# Patient Record
Sex: Male | Born: 1990 | Race: White | Hispanic: No | Smoking: Never smoker
Health system: Southern US, Community
[De-identification: ages and names within clinical notes are randomized; demographics above are authoritative.]

## PROBLEM LIST (undated history)

## (undated) DIAGNOSIS — F319 Bipolar disorder, unspecified: Secondary | ICD-10-CM

## (undated) DIAGNOSIS — F209 Schizophrenia, unspecified: Secondary | ICD-10-CM

## (undated) DIAGNOSIS — I10 Essential (primary) hypertension: Secondary | ICD-10-CM

---
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
## 2010-12-14 ENCOUNTER — Emergency Department: Payer: Self-pay | Admitting: Emergency Medicine

## 2011-09-15 ENCOUNTER — Emergency Department: Payer: Self-pay | Admitting: Emergency Medicine

## 2012-03-20 ENCOUNTER — Emergency Department: Payer: Self-pay | Admitting: Emergency Medicine

## 2012-07-16 ENCOUNTER — Emergency Department: Payer: Self-pay | Admitting: Emergency Medicine

## 2012-07-20 ENCOUNTER — Emergency Department: Payer: Self-pay | Admitting: Emergency Medicine

## 2012-09-27 ENCOUNTER — Emergency Department: Payer: Self-pay | Admitting: Emergency Medicine

## 2012-09-27 LAB — URINALYSIS, COMPLETE
Bilirubin,UR: NEGATIVE
Blood: NEGATIVE
Glucose,UR: NEGATIVE mg/dL (ref 0–75)
Ketone: NEGATIVE
Leukocyte Esterase: NEGATIVE
Nitrite: NEGATIVE
Ph: 5 (ref 4.5–8.0)
Protein: NEGATIVE
RBC,UR: 2 /HPF (ref 0–5)
Specific Gravity: 1.024 (ref 1.003–1.030)
Squamous Epithelial: NONE SEEN
WBC UR: 2 /HPF (ref 0–5)

## 2012-09-27 LAB — COMPREHENSIVE METABOLIC PANEL
Albumin: 4.4 g/dL (ref 3.4–5.0)
Alkaline Phosphatase: 91 U/L (ref 50–136)
Anion Gap: 10 (ref 7–16)
BUN: 8 mg/dL (ref 7–18)
Bilirubin,Total: 0.3 mg/dL (ref 0.2–1.0)
Calcium, Total: 9.7 mg/dL (ref 8.5–10.1)
Chloride: 108 mmol/L — ABNORMAL HIGH (ref 98–107)
Co2: 21 mmol/L (ref 21–32)
Creatinine: 0.94 mg/dL (ref 0.60–1.30)
EGFR (African American): >60
EGFR (Non-African Amer.): >60
Glucose: 110 mg/dL — ABNORMAL HIGH (ref 65–99)
Osmolality: 277 (ref 275–301)
Potassium: 4.3 mmol/L (ref 3.5–5.1)
SGOT(AST): 30 U/L (ref 15–37)
SGPT (ALT): 62 U/L (ref 12–78)
Sodium: 139 mmol/L (ref 136–145)
Total Protein: 8.2 g/dL (ref 6.4–8.2)

## 2012-09-27 LAB — CBC
HCT: 47.2 % (ref 40.0–52.0)
HGB: 16.4 g/dL (ref 13.0–18.0)
MCH: 30.8 pg (ref 26.0–34.0)
MCHC: 34.6 g/dL (ref 32.0–36.0)
MCV: 89 fL (ref 80–100)
Platelet: 319 10*3/uL (ref 150–440)
RBC: 5.32 10*6/uL (ref 4.40–5.90)
RDW: 12.7 % (ref 11.5–14.5)
WBC: 19.2 10*3/uL — ABNORMAL HIGH (ref 3.8–10.6)

## 2012-09-27 LAB — LIPASE, BLOOD: Lipase: 136 U/L (ref 73–393)

## 2013-01-03 ENCOUNTER — Emergency Department: Payer: Self-pay | Admitting: Emergency Medicine

## 2013-12-25 ENCOUNTER — Emergency Department: Payer: Self-pay | Admitting: Emergency Medicine

## 2014-05-07 ENCOUNTER — Emergency Department: Payer: Self-pay | Admitting: Emergency Medicine

## 2015-04-09 ENCOUNTER — Emergency Department
Admission: EM | Admit: 2015-04-09 | Discharge: 2015-04-09 | Disposition: A | Discharge status: Home / Self Care | Payer: Federal, State, Local not specified - PPO | Attending: Emergency Medicine | Admitting: Emergency Medicine

## 2015-04-09 ENCOUNTER — Telehealth: Payer: Self-pay | Admitting: Emergency Medicine

## 2015-04-09 DIAGNOSIS — Z880 Allergy status to penicillin: Secondary | ICD-10-CM | POA: Insufficient documentation

## 2015-04-09 DIAGNOSIS — J029 Acute pharyngitis, unspecified: Secondary | ICD-10-CM | POA: Insufficient documentation

## 2015-04-09 DIAGNOSIS — M791 Myalgia: Secondary | ICD-10-CM | POA: Insufficient documentation

## 2015-04-09 LAB — POCT RAPID STREP A: STREPTOCOCCUS, GROUP A SCREEN (DIRECT): NEGATIVE

## 2015-04-09 MED ORDER — AZITHROMYCIN 500 MG PO TABS: 500.0000 mg | ORAL_TABLET | Freq: Every day | ORAL | Status: DC

## 2015-04-09 MED ORDER — AZITHROMYCIN 250 MG PO TABS
500.0000 mg | ORAL_TABLET | Freq: Once | ORAL | Status: AC
  Administered 2015-04-09: 500 mg via ORAL

## 2015-04-09 MED ORDER — ACETAMINOPHEN 325 MG PO TABS
650.0000 mg | ORAL_TABLET | Freq: Once | ORAL | Status: AC
  Administered 2015-04-09: 650 mg via ORAL

## 2015-04-09 MED ORDER — ACETAMINOPHEN 325 MG PO TABS
ORAL_TABLET | ORAL | Status: AC
  Administered 2015-04-09: 650 mg via ORAL
  Filled 2015-04-09: qty 2

## 2015-04-09 MED ORDER — LIDOCAINE VISCOUS 2 % MT SOLN: 20.0000 mL | OROMUCOSAL | Status: DC | PRN

## 2015-04-09 MED ORDER — AZITHROMYCIN 500 MG PO TABS: 500.0000 mg | ORAL_TABLET | Freq: Every day | ORAL | Status: AC

## 2015-04-09 MED ORDER — AZITHROMYCIN 250 MG PO TABS
ORAL_TABLET | ORAL | Status: AC
  Administered 2015-04-09: 500 mg via ORAL
  Filled 2015-04-09: qty 2

## 2015-04-09 NOTE — ED Provider Notes (Signed)
[facility] Emergency Department Provider Note  ____________________________________________  Time seen: 5:50AM  I have reviewed the triage vital signs and the nursing notes.   HISTORY  Chief Complaint Sore Throat     HPI Devon Thompson is a 24 y.o. male presents with sore throat and generalized myalgias times one day, subjective fevers at home.     No past medical history on file.  There are no active problems to display for this patient.   No past surgical history on file.  No current outpatient prescriptions on file.  Allergies Penicillins  No family history on file.  Social History History  Substance Use Topics  . Smoking status: Not on file  . Smokeless tobacco: Not on file  . Alcohol Use: Not on file    Review of Systems  Constitutional: Negative for fever. Eyes: Negative for visual changes. ENT: Negative for sore throat. Cardiovascular: Negative for chest pain. Respiratory: Negative for shortness of breath. Gastrointestinal: Negative for abdominal pain, vomiting and diarrhea. Genitourinary: Negative for dysuria. Musculoskeletal: Negative for back pain. Skin: Negative for rash. Neurological: Negative for headaches, focal weakness or numbness.   10-point ROS otherwise negative.  ____________________________________________   PHYSICAL EXAM:  VITAL SIGNS: ED Triage Vitals  Enc Vitals Group     BP 04/09/15 0322 120/80 mmHg     Pulse Rate 04/09/15 0322 114     Resp 04/09/15 0322 18     Temp 04/09/15 0322 100.1 F (37.8 C)     Temp Source 04/09/15 0322 Oral     SpO2 04/09/15 0322 100 %     Weight 04/09/15 0322 220 lb (99.791 kg)     Height 04/09/15 0322 5\' 6"  (1.676 m)     Head Cir --      Peak Flow --      Pain Score 04/09/15 0323 10     Pain Loc --      Pain Edu? --      Excl. in GC? --      Constitutional: Alert and oriented. Well appearing and in no distress. Eyes: Conjunctivae are normal. PERRL.  Normal extraocular movements. ENT   Head: Normocephalic and atraumatic.   Nose: No congestion/rhinnorhea.   Mouth/Throat: Mucous membranes are moist. Pharyngeal erythema noted with scant exudate   Neck: No stridor. Hematological/Lymphatic/Immunilogical: Positive cervical lymphadenopathy. Cardiovascular: Normal rate, regular rhythm. Normal and symmetric distal pulses are present in all extremities. No murmurs, rubs, or gallops. Respiratory: Normal respiratory effort without tachypnea nor retractions. Breath sounds are clear and equal bilaterally. No wheezes/rales/rhonchi. Gastrointestinal: Soft and nontender. No distention. There is no CVA tenderness. Genitourinary: deferred Musculoskeletal: Nontender with normal range of motion in all extremities. No joint effusions.  No lower extremity tenderness nor edema. Neurologic:  Normal speech and language. No gross focal neurologic deficits are appreciated. Speech is normal.  Skin:  Skin is warm, dry and intact. No rash noted. Psychiatric: Mood and affect are normal. Speech and behavior are normal. Patient exhibits appropriate insight and judgment.  ____________________________________________         INITIAL IMPRESSION / ASSESSMENT AND PLAN / ED COURSE  Pertinent labs & imaging results that were available during my care of the patient were reviewed by me and considered in my medical decision making (see chart for details).  History and physical exam consistent with pharyngitis patient received azithromycin 500 mg in the emergency department  ____________________________________________   FINAL CLINICAL IMPRESSION(S) / ED DIAGNOSES  Final diagnoses:  Acute pharyngitis, unspecified  pharyngitis type         Darci Currentandolph N Remas Sobel, MD 04/09/15 [phone]

## 2015-04-09 NOTE — ED Notes (Signed)
Pt called asking for rx for med to numb throat.  Says we gave him 2 rx for antibiotics and forgot the numbing med.  Per dr Mindi Junkergottlieb can give lidocaine sol.  rx written and then called in to [facility] [address]

## 2015-04-09 NOTE — ED Notes (Signed)
Pt with sore throat and fever since yest

## 2015-04-09 NOTE — Discharge Instructions (Signed)

## 2015-04-09 NOTE — ED Notes (Signed)

## 2015-04-12 LAB — CULTURE, GROUP A STREP (THRC)

## 2018-12-02 DIAGNOSIS — Z5181 Encounter for therapeutic drug level monitoring: Secondary | ICD-10-CM | POA: Diagnosis not present

## 2018-12-02 DIAGNOSIS — F1120 Opioid dependence, uncomplicated: Secondary | ICD-10-CM | POA: Diagnosis not present

## 2018-12-02 DIAGNOSIS — K047 Periapical abscess without sinus: Secondary | ICD-10-CM | POA: Diagnosis not present

## 2019-01-03 DIAGNOSIS — Z5181 Encounter for therapeutic drug level monitoring: Secondary | ICD-10-CM | POA: Diagnosis not present

## 2019-01-03 DIAGNOSIS — F1120 Opioid dependence, uncomplicated: Secondary | ICD-10-CM | POA: Diagnosis not present

## 2019-02-01 DIAGNOSIS — F1120 Opioid dependence, uncomplicated: Secondary | ICD-10-CM | POA: Diagnosis not present

## 2019-02-01 DIAGNOSIS — Z5181 Encounter for therapeutic drug level monitoring: Secondary | ICD-10-CM | POA: Diagnosis not present

## 2019-04-25 DIAGNOSIS — Z5181 Encounter for therapeutic drug level monitoring: Secondary | ICD-10-CM | POA: Diagnosis not present

## 2019-05-17 DIAGNOSIS — K047 Periapical abscess without sinus: Secondary | ICD-10-CM | POA: Diagnosis not present

## 2019-05-17 DIAGNOSIS — Z5181 Encounter for therapeutic drug level monitoring: Secondary | ICD-10-CM | POA: Diagnosis not present

## 2019-06-22 DIAGNOSIS — Z5181 Encounter for therapeutic drug level monitoring: Secondary | ICD-10-CM | POA: Diagnosis not present

## 2019-07-20 DIAGNOSIS — Z5181 Encounter for therapeutic drug level monitoring: Secondary | ICD-10-CM | POA: Diagnosis not present

## 2019-07-20 DIAGNOSIS — Z6828 Body mass index (BMI) 28.0-28.9, adult: Secondary | ICD-10-CM | POA: Diagnosis not present

## 2019-08-17 DIAGNOSIS — F1120 Opioid dependence, uncomplicated: Secondary | ICD-10-CM | POA: Diagnosis not present

## 2019-08-17 DIAGNOSIS — Z9852 Vasectomy status: Secondary | ICD-10-CM | POA: Diagnosis not present

## 2019-08-17 DIAGNOSIS — Z6827 Body mass index (BMI) 27.0-27.9, adult: Secondary | ICD-10-CM | POA: Diagnosis not present

## 2019-08-17 DIAGNOSIS — Z5181 Encounter for therapeutic drug level monitoring: Secondary | ICD-10-CM | POA: Diagnosis not present

## 2019-09-14 DIAGNOSIS — Z5181 Encounter for therapeutic drug level monitoring: Secondary | ICD-10-CM | POA: Diagnosis not present

## 2019-09-14 DIAGNOSIS — K047 Periapical abscess without sinus: Secondary | ICD-10-CM | POA: Diagnosis not present

## 2019-09-14 DIAGNOSIS — Z6827 Body mass index (BMI) 27.0-27.9, adult: Secondary | ICD-10-CM | POA: Diagnosis not present

## 2019-09-14 DIAGNOSIS — F1120 Opioid dependence, uncomplicated: Secondary | ICD-10-CM | POA: Diagnosis not present

## 2019-10-13 DIAGNOSIS — Z5181 Encounter for therapeutic drug level monitoring: Secondary | ICD-10-CM | POA: Diagnosis not present

## 2019-11-21 DIAGNOSIS — Z5181 Encounter for therapeutic drug level monitoring: Secondary | ICD-10-CM | POA: Diagnosis not present

## 2019-11-23 ENCOUNTER — Ambulatory Visit: Payer: Federal, State, Local not specified - PPO | Attending: Internal Medicine

## 2020-01-14 ENCOUNTER — Emergency Department: Payer: Medicaid Other

## 2020-01-14 ENCOUNTER — Emergency Department
Admission: EM | Admit: 2020-01-14 | Discharge: 2020-01-14 | Disposition: A | Discharge status: Home / Self Care | Payer: Medicaid Other | Attending: Student | Admitting: Student

## 2020-01-14 DIAGNOSIS — S0990XA Unspecified injury of head, initial encounter: Secondary | ICD-10-CM | POA: Diagnosis not present

## 2020-01-14 DIAGNOSIS — S0003XA Contusion of scalp, initial encounter: Secondary | ICD-10-CM | POA: Diagnosis not present

## 2020-01-14 DIAGNOSIS — Y999 Unspecified external cause status: Secondary | ICD-10-CM | POA: Insufficient documentation

## 2020-01-14 DIAGNOSIS — S0001XA Abrasion of scalp, initial encounter: Secondary | ICD-10-CM | POA: Diagnosis not present

## 2020-01-14 DIAGNOSIS — Y939 Activity, unspecified: Secondary | ICD-10-CM | POA: Insufficient documentation

## 2020-01-14 DIAGNOSIS — T148XXA Other injury of unspecified body region, initial encounter: Secondary | ICD-10-CM

## 2020-01-14 DIAGNOSIS — Z23 Encounter for immunization: Secondary | ICD-10-CM | POA: Insufficient documentation

## 2020-01-14 DIAGNOSIS — Z0289 Encounter for other administrative examinations: Secondary | ICD-10-CM | POA: Insufficient documentation

## 2020-01-14 DIAGNOSIS — Y929 Unspecified place or not applicable: Secondary | ICD-10-CM | POA: Diagnosis not present

## 2020-01-14 DIAGNOSIS — S4991XA Unspecified injury of right shoulder and upper arm, initial encounter: Secondary | ICD-10-CM | POA: Diagnosis present

## 2020-01-14 DIAGNOSIS — Z008 Encounter for other general examination: Secondary | ICD-10-CM

## 2020-01-14 DIAGNOSIS — S42021A Displaced fracture of shaft of right clavicle, initial encounter for closed fracture: Secondary | ICD-10-CM | POA: Diagnosis not present

## 2020-01-14 DIAGNOSIS — M25511 Pain in right shoulder: Secondary | ICD-10-CM | POA: Diagnosis not present

## 2020-01-14 MED ORDER — TETANUS-DIPHTH-ACELL PERTUSSIS 5-2.5-18.5 LF-MCG/0.5 IM SUSP
0.5000 mL | Freq: Once | INTRAMUSCULAR | Status: AC
  Administered 2020-01-14: 0.5 mL via INTRAMUSCULAR
  Filled 2020-01-14: qty 0.5

## 2020-01-14 NOTE — ED Provider Notes (Signed)
[facility] Emergency Department Provider Note  ____________________________________________   First MD Initiated Contact with Patient 01/14/20 1825     (approximate)  I have reviewed the triage vital signs and the nursing notes.  History  Chief Complaint Medical Clearance    HPI Devon Thompson is a 29 y.o. male who presents for medical clearance for jail. Patient refuses to provide significant history, but was reportedly tased by PD.  He is noted to have an abrasion and hematoma to the right occiput and complains of right shoulder and collarbone pain.  Pain is 9/10, located to the right shoulder/collarbone, no radiation, sharp, no alleviating or aggravating components.  Denies any weakness, numbness, tingling of his right hand.  Right-hand-dominant.   Past Medical Hx No past medical history on file.  Problem List There are no problems to display for this patient.   Past Surgical Hx Non contributory  Medications Prior to Admission medications   Medication Sig Start Date End Date Taking? Authorizing Provider  azithromycin (ZITHROMAX) 500 MG tablet Take 1 tablet (500 mg total) by mouth daily. 04/09/15   Gregor Hams, MD  lidocaine (XYLOCAINE) 2 % solution Use as directed 20 mLs in the mouth or throat as needed for mouth pain. 04/09/15   Ferman Hamming L, DO    Allergies Penicillins  Family Hx No family history on file.  Social Hx Social History   Tobacco Use  . Smoking status: Not on file  Substance Use Topics  . Alcohol use: Not on file  . Drug use: Not on file     Review of Systems  Constitutional: Negative for fever, chills. Eyes: Negative for visual changes. ENT: Negative for sore throat. Cardiovascular: Negative for chest pain. Respiratory: Negative for shortness of breath. Gastrointestinal: Negative for nausea, vomiting.  Genitourinary: Negative for dysuria. Musculoskeletal: + R shoulder pain, deformity of mid  clavicle Skin: + abrasion/hematoma to R occiput Neurological: Negative for headaches.   Physical Exam  Vital Signs: ED Triage Vitals  Enc Vitals Group     BP 01/14/20 1814 (!) 137/97     Pulse Rate 01/14/20 1814 (!) 120     Resp 01/14/20 1814 20     Temp 01/14/20 1814 98.2 F (36.8 C)     Temp Source 01/14/20 1814 Oral     SpO2 01/14/20 1814 96 %     Weight 01/14/20 1822 218 lb 4.1 oz (99 kg)     Height 01/14/20 1822 5\' 8"  (1.727 m)     Head Circumference --      Peak Flow --      Pain Score 01/14/20 1815 9     Pain Loc --      Pain Edu? --      Excl. in [place]? --     Constitutional: Alert and oriented.  Head: Normocephalic. Hematoma with abrasion to R occiput.  Eyes: Conjunctivae clear. Sclera anicteric. Nose: No congestion. No rhinorrhea. Neck: No stridor.  No midline tenderness. FROM. Cardiovascular: Normal rate, regular rhythm. Extremities well perfused. 2+ right radial pulse. Fingers WWP. Cap refill less than 2 seconds. Respiratory: Normal respiratory effort.  Chest wall stable, NT. Gastrointestinal: Soft. Non-tender. Non-distended.  Musculoskeletal: TTP to distal clavicle with palpable deformity. No skin tenting. Distally NV in tact. Motor/sensation in tact in radial, ulnar, median distribution. 2+ radial pulse.  Neurologic:  Normal speech and language. No gross focal neurologic deficits are appreciated.  Skin: Abrasion/hematoma as above. Psychiatric: cursing and argumentative on arrival.  Radiology  XR: IMPRESSION:  1. Comminuted mid right clavicular fracture.   CT head:  IMPRESSION:  Normal head CT.    Procedures  Procedure(s) performed (including critical care):  Procedures   Initial Impression / Assessment and Plan / ED Course  29 y.o. male who presents to the ED for medical clearance, as above. Exam notable for hematoma/abrasion to occiput and pain, palpable deformity to R clavicle.   Will updated Tdap, and obtain imaging.    CT negative. XR  reveals mid clavicle fracture. No skin tenting on exam. Closed, and distally NV in tact. Will plan for sling/shoulder immobilizer and follow up with orthopedics, who are aware and agree with plan. Otherwise stable for discharge.   Final Clinical Impression(s) / ED Diagnosis  Final diagnoses:  Closed displaced fracture of shaft of right clavicle, initial encounter  Hematoma  Medical clearance for incarceration       Note:  This document was prepared using Dragon voice recognition software and may include unintentional dictation errors.   Miguel Aschoff., MD 01/15/20 [phone]

## 2020-01-14 NOTE — ED Triage Notes (Addendum)
Pt arrives via [place] police, in police custody ambulatory to hall 1 from the ems bay. Pt is cursing at the officer who is with him. Attempting to triage patient, patient is angry with the officers and staff, attempt to ask what happened and the patient states "same thing that always happens" pt asked if he lost consciousness and patients states, " I don't remember" "isn't that what always happens when you get slammed by the police" when asking patient his height pt responds " I dont't know" pt asked what his weight is, pt states 'does it matter?" attempt to answer pt with reason for weight regarding medications and pt calls this RN " you fucking bitch" triage assessment complete at this point due to pt's aggression  Pt refused to verify his name and birthday

## 2020-01-14 NOTE — ED Notes (Signed)
Pt transported to CT. Officers present.

## 2020-01-14 NOTE — ED Notes (Signed)
This note is not being shared with the patient for the following reason: To prevent harm (release of this note would result in harm to the life or physical safety of the patient or another).   Pt aggressively stepping towards officer, pt is in police custody and wearing handcuffs. Pt cursing officer with, "you fucking bitch! Your mama knows it! Your mama should have had an abortion when she was pregnant with you. You want to fight me don't you! You want to taze me again!"  Pt has an abrasion and hematoma to the right posterior scalp area. Pt is c/o right shoulder pain as well

## 2020-01-14 NOTE — ED Notes (Signed)
Discharge papers reviewed with pt. Discharge papers and shoulder immobilizer given to officer. Unable to obtain electronic signature due to pt being in handcuffs.

## 2020-01-14 NOTE — ED Notes (Signed)
This RN on phone w/ jail RN [phone]). Jail RN advised this RN to send shoulder immobilizer with pt along with discharge papers. No shoulder immobilizer placed at this time, pt remains in handcuffs under police custody. MD made aware.

## 2020-01-14 NOTE — Discharge Instructions (Addendum)
Your imaging showed you have break of your RIGHT collarbone.  Use the sling immobilizer for support and remain in this until you follow up with orthopedics in clinic. Information is below. Do not do any heavy lifting until you are seen in clinic and cleared by the orthopedic doctors.   Return for re-evaluation if you develop any new or worsening symptoms.

## 2020-01-17 DIAGNOSIS — M542 Cervicalgia: Secondary | ICD-10-CM | POA: Diagnosis not present

## 2020-01-17 DIAGNOSIS — Z880 Allergy status to penicillin: Secondary | ICD-10-CM | POA: Diagnosis not present

## 2020-01-17 DIAGNOSIS — F17210 Nicotine dependence, cigarettes, uncomplicated: Secondary | ICD-10-CM | POA: Diagnosis not present

## 2020-01-17 DIAGNOSIS — S42021A Displaced fracture of shaft of right clavicle, initial encounter for closed fracture: Secondary | ICD-10-CM | POA: Diagnosis not present

## 2020-01-17 DIAGNOSIS — R0602 Shortness of breath: Secondary | ICD-10-CM | POA: Diagnosis not present

## 2020-01-17 DIAGNOSIS — R519 Headache, unspecified: Secondary | ICD-10-CM | POA: Diagnosis not present

## 2020-01-17 DIAGNOSIS — M549 Dorsalgia, unspecified: Secondary | ICD-10-CM | POA: Diagnosis not present

## 2020-01-18 DIAGNOSIS — S42021A Displaced fracture of shaft of right clavicle, initial encounter for closed fracture: Secondary | ICD-10-CM | POA: Diagnosis not present

## 2020-01-19 DIAGNOSIS — S42024D Nondisplaced fracture of shaft of right clavicle, subsequent encounter for fracture with routine healing: Secondary | ICD-10-CM | POA: Diagnosis not present

## 2020-01-19 DIAGNOSIS — Z5181 Encounter for therapeutic drug level monitoring: Secondary | ICD-10-CM | POA: Diagnosis not present

## 2020-02-01 DIAGNOSIS — Z5181 Encounter for therapeutic drug level monitoring: Secondary | ICD-10-CM | POA: Diagnosis not present

## 2020-02-15 DIAGNOSIS — Z5181 Encounter for therapeutic drug level monitoring: Secondary | ICD-10-CM | POA: Diagnosis not present

## 2020-05-23 DIAGNOSIS — Z419 Encounter for procedure for purposes other than remedying health state, unspecified: Secondary | ICD-10-CM | POA: Diagnosis not present

## 2020-06-23 DIAGNOSIS — Z419 Encounter for procedure for purposes other than remedying health state, unspecified: Secondary | ICD-10-CM | POA: Diagnosis not present

## 2020-07-24 DIAGNOSIS — Z419 Encounter for procedure for purposes other than remedying health state, unspecified: Secondary | ICD-10-CM | POA: Diagnosis not present

## 2020-08-08 DIAGNOSIS — R7309 Other abnormal glucose: Secondary | ICD-10-CM | POA: Diagnosis not present

## 2020-08-08 DIAGNOSIS — Z743 Need for continuous supervision: Secondary | ICD-10-CM | POA: Diagnosis not present

## 2020-08-08 DIAGNOSIS — E1165 Type 2 diabetes mellitus with hyperglycemia: Secondary | ICD-10-CM | POA: Diagnosis not present

## 2020-08-23 DIAGNOSIS — Z419 Encounter for procedure for purposes other than remedying health state, unspecified: Secondary | ICD-10-CM | POA: Diagnosis not present

## 2020-09-23 DIAGNOSIS — Z419 Encounter for procedure for purposes other than remedying health state, unspecified: Secondary | ICD-10-CM | POA: Diagnosis not present

## 2020-10-23 DIAGNOSIS — Z419 Encounter for procedure for purposes other than remedying health state, unspecified: Secondary | ICD-10-CM | POA: Diagnosis not present

## 2020-11-23 DIAGNOSIS — Z419 Encounter for procedure for purposes other than remedying health state, unspecified: Secondary | ICD-10-CM | POA: Diagnosis not present

## 2020-12-09 ENCOUNTER — Emergency Department: Payer: Medicaid Other

## 2020-12-09 ENCOUNTER — Encounter: Payer: Self-pay | Admitting: Emergency Medicine

## 2020-12-09 ENCOUNTER — Emergency Department
Admission: EM | Admit: 2020-12-09 | Discharge: 2020-12-09 | Disposition: A | Discharge status: Home / Self Care | Payer: Medicaid Other | Attending: Emergency Medicine | Admitting: Emergency Medicine

## 2020-12-09 ENCOUNTER — Other Ambulatory Visit: Payer: Self-pay

## 2020-12-09 DIAGNOSIS — I499 Cardiac arrhythmia, unspecified: Secondary | ICD-10-CM | POA: Diagnosis not present

## 2020-12-09 DIAGNOSIS — F17200 Nicotine dependence, unspecified, uncomplicated: Secondary | ICD-10-CM | POA: Diagnosis not present

## 2020-12-09 DIAGNOSIS — Z8781 Personal history of (healed) traumatic fracture: Secondary | ICD-10-CM | POA: Insufficient documentation

## 2020-12-09 DIAGNOSIS — M25511 Pain in right shoulder: Secondary | ICD-10-CM | POA: Insufficient documentation

## 2020-12-09 DIAGNOSIS — R6889 Other general symptoms and signs: Secondary | ICD-10-CM | POA: Diagnosis not present

## 2020-12-09 DIAGNOSIS — Z743 Need for continuous supervision: Secondary | ICD-10-CM | POA: Diagnosis not present

## 2020-12-09 DIAGNOSIS — R0789 Other chest pain: Secondary | ICD-10-CM | POA: Diagnosis not present

## 2020-12-09 DIAGNOSIS — R079 Chest pain, unspecified: Secondary | ICD-10-CM | POA: Diagnosis not present

## 2020-12-09 DIAGNOSIS — M898X1 Other specified disorders of bone, shoulder: Secondary | ICD-10-CM

## 2020-12-09 NOTE — ED Notes (Signed)
Pt to ED for c/o Rt clavicle pain s/p being tazed by [employer].

## 2020-12-09 NOTE — ED Triage Notes (Signed)
Pt to ED via EMS, under custody of [place] [employer] for medical clearance to jail. Pt c/o right clavicle pain x2 weeks when pt reports he was tased by police. Pt is calm and cooperative with steady gait.

## 2020-12-09 NOTE — ED Provider Notes (Signed)
[facility] Emergency Department Provider Note ____________________________________________  Time seen: 2210  I have reviewed the triage vital signs and the nursing notes.  HISTORY  Chief Complaint  Medical Clearance  HPI Devon Thompson is a 30 y.o. male presents to the ED in the custody [facility] ED, for evaluation of right shoulder pain at the clavicle.   She is requiring medical clearance.  He complains of pain to the right clavicle for the last 2 weeks.  Describes onset after he was tased by police officers in the act of his arrest.  Patient denies any other complaints at this time.  Generally, the patient was seen 6 months ago in February, with a similar story where he was tased by local police authorities during the arrest, he presented with an acute comminuted fracture of the right clavicle at that time.  History reviewed. No pertinent past medical history.  There are no problems to display for this patient.   History reviewed. No pertinent surgical history.  Prior to Admission medications   Not on File    Allergies Penicillins  History reviewed. No pertinent family history.  Social History Social History   Tobacco Use  . Smoking status: Current Every Day Smoker  . Smokeless tobacco: Never Used  Substance Use Topics  . Alcohol use: Yes  . Drug use: Yes    Review of Systems  Constitutional: Negative for fever. Eyes: Negative for visual changes. ENT: Negative for sore throat. Cardiovascular: Negative for chest pain. Respiratory: Negative for shortness of breath. Gastrointestinal: Negative for abdominal pain, vomiting and diarrhea. Genitourinary: Negative for dysuria. Musculoskeletal: Negative for back pain. Right clavicle pain as above.  Skin: Negative for rash. Neurological: Negative for headaches, focal weakness or numbness. ____________________________________________  PHYSICAL EXAM:  VITAL SIGNS: ED Triage Vitals  Enc Vitals  Group     BP 12/09/20 2154 (!) 136/96     Pulse Rate 12/09/20 2154 (!) 120     Resp 12/09/20 2154 18     Temp 12/09/20 2154 98.1 F (36.7 C)     Temp Source 12/09/20 2154 Oral     SpO2 12/09/20 2154 98 %     Weight 12/09/20 2155 170 lb (77.1 kg)     Height 12/09/20 2155 5\' 6"  (1.676 m)     Head Circumference --      Peak Flow --      Pain Score 12/09/20 2154 8     Pain Loc --      Pain Edu? --      Excl. in GC? --     Constitutional: Alert and oriented. Well appearing and in no distress. Head: Normocephalic and atraumatic. Eyes: Conjunctivae are normal. Normal extraocular movements Neck: Supple. Normal ROM Cardiovascular: Normal rate, regular rhythm. Normal distal pulses. Respiratory: Normal respiratory effort. No wheezes/rales/rhonchi. Gastrointestinal: Soft and nontender. No distention. Musculoskeletal: chronic deformity of the right clavicle. Midshaft deformity consistent with a chronic fracture. Normal shoulder ROM. Normal composite fist bilaterally.  Nontender with normal range of motion in all extremities.  Neurologic:  Normal gait without ataxia. Normal speech and language. No gross focal neurologic deficits are appreciated. Skin:  Skin is warm, dry and intact. No rash noted. ___________________________________________   RADIOLOGY  DG Right Clavicle  IMPRESSION: Old right mid clavicle fracture.  No acute osseous abnormality.  I, 2155, personally viewed and evaluated these images (plain radiographs) as part of my medical decision making, as well as reviewing the written report by the radiologist.  ____________________________________________  PROCEDURES  Procedures ____________________________________________  INITIAL IMPRESSION / ASSESSMENT AND PLAN / ED COURSE  Patient to the ED in the custody of [place] [employer], for medical clearance.  Patient presents with complaints of intermittent right clavicle pain after his arrest 2 weeks ago.  Patient's exam  is benign and his x-ray shows a stable, healed, fused old fracture to the right clavicle.  He is discharged at this time to the care of [employer].  He is medically cleared for transport.   Devon Thompson was evaluated in Emergency Department on 12/09/2020 for the symptoms described in the history of present illness. He was evaluated in the context of the global COVID-19 pandemic, which necessitated consideration that the patient might be at risk for infection with the SARS-CoV-2 virus that causes COVID-19. Institutional protocols and algorithms that pertain to the evaluation of patients at risk for COVID-19 are in a state of rapid change based on information released by regulatory bodies including the CDC and federal and state organizations. These policies and algorithms were followed during the patient's care in the ED. ____________________________________________  FINAL CLINICAL IMPRESSION(S) / ED DIAGNOSES  Final diagnoses:  Clavicle pain  H/O clavicle fracture      Karmen Stabs Charlesetta Ivory, PA-C 12/09/20 2249    Phineas Semen, MD 12/09/20 2255

## 2020-12-09 NOTE — Discharge Instructions (Signed)
You exam and XR are normal. You have stable, fused, old healed fracture of your right collar bone. Take OTC Tylenol or Motrin as needed.

## 2020-12-10 ENCOUNTER — Telehealth: Payer: Self-pay

## 2020-12-10 NOTE — Telephone Encounter (Signed)
Transition Care Management Unsuccessful Follow-up Telephone Call  Date of discharge and from where:  12/09/2020 [facility] ED  Attempts:  1st Attempt  Reason for unsuccessful TCM follow-up call:  Left voice message

## 2020-12-11 DIAGNOSIS — S022XXA Fracture of nasal bones, initial encounter for closed fracture: Secondary | ICD-10-CM | POA: Diagnosis not present

## 2020-12-11 NOTE — Telephone Encounter (Signed)
Transition Care Management Unsuccessful Follow-up Telephone Call  Date of discharge and from where:  12/09/2020 [facility] ED  Attempts:  2nd Attempt  Reason for unsuccessful TCM follow-up call:  Left voice message, number on file is for "[employer]"goes straight to voicemail.

## 2020-12-12 ENCOUNTER — Emergency Department: Payer: Medicaid Other

## 2020-12-12 ENCOUNTER — Emergency Department
Admission: EM | Admit: 2020-12-12 | Discharge: 2020-12-12 | Disposition: A | Discharge status: Home / Self Care | Payer: Medicaid Other | Attending: Emergency Medicine | Admitting: Emergency Medicine

## 2020-12-12 ENCOUNTER — Other Ambulatory Visit: Payer: Self-pay

## 2020-12-12 DIAGNOSIS — S022XXA Fracture of nasal bones, initial encounter for closed fracture: Secondary | ICD-10-CM | POA: Diagnosis not present

## 2020-12-12 DIAGNOSIS — R220 Localized swelling, mass and lump, head: Secondary | ICD-10-CM | POA: Diagnosis not present

## 2020-12-12 DIAGNOSIS — M79661 Pain in right lower leg: Secondary | ICD-10-CM | POA: Insufficient documentation

## 2020-12-12 DIAGNOSIS — M545 Low back pain, unspecified: Secondary | ICD-10-CM | POA: Diagnosis not present

## 2020-12-12 DIAGNOSIS — Z0489 Encounter for examination and observation for other specified reasons: Secondary | ICD-10-CM | POA: Diagnosis not present

## 2020-12-12 DIAGNOSIS — Y92149 Unspecified place in prison as the place of occurrence of the external cause: Secondary | ICD-10-CM | POA: Diagnosis not present

## 2020-12-12 DIAGNOSIS — F17200 Nicotine dependence, unspecified, uncomplicated: Secondary | ICD-10-CM | POA: Diagnosis not present

## 2020-12-12 DIAGNOSIS — R102 Pelvic and perineal pain: Secondary | ICD-10-CM | POA: Diagnosis not present

## 2020-12-12 DIAGNOSIS — R109 Unspecified abdominal pain: Secondary | ICD-10-CM | POA: Diagnosis not present

## 2020-12-12 DIAGNOSIS — S3993XA Unspecified injury of pelvis, initial encounter: Secondary | ICD-10-CM | POA: Diagnosis not present

## 2020-12-12 DIAGNOSIS — M542 Cervicalgia: Secondary | ICD-10-CM | POA: Insufficient documentation

## 2020-12-12 DIAGNOSIS — S3991XA Unspecified injury of abdomen, initial encounter: Secondary | ICD-10-CM | POA: Diagnosis not present

## 2020-12-12 DIAGNOSIS — M25511 Pain in right shoulder: Secondary | ICD-10-CM | POA: Insufficient documentation

## 2020-12-12 DIAGNOSIS — S299XXA Unspecified injury of thorax, initial encounter: Secondary | ICD-10-CM | POA: Diagnosis not present

## 2020-12-12 DIAGNOSIS — R079 Chest pain, unspecified: Secondary | ICD-10-CM | POA: Diagnosis not present

## 2020-12-12 DIAGNOSIS — T1490XA Injury, unspecified, initial encounter: Secondary | ICD-10-CM

## 2020-12-12 DIAGNOSIS — S0992XA Unspecified injury of nose, initial encounter: Secondary | ICD-10-CM | POA: Diagnosis present

## 2020-12-12 MED ORDER — ACETAMINOPHEN 500 MG PO TABS
1000.0000 mg | ORAL_TABLET | Freq: Once | ORAL | Status: AC
  Administered 2020-12-12: 1000 mg via ORAL
  Filled 2020-12-12: qty 2

## 2020-12-12 MED ORDER — OXYCODONE HCL 5 MG PO TABS
5.0000 mg | ORAL_TABLET | Freq: Once | ORAL | Status: AC
  Administered 2020-12-12: 5 mg via ORAL
  Filled 2020-12-12: qty 1

## 2020-12-12 MED ORDER — IOHEXOL 300 MG/ML  SOLN
100.0000 mL | Freq: Once | INTRAMUSCULAR | Status: AC | PRN
  Administered 2020-12-12: 100 mL via INTRAVENOUS
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
## 2020-12-12 NOTE — ED Notes (Signed)
Sling given to officer, [employer] will provide for patient when they arrive.

## 2020-12-12 NOTE — ED Notes (Signed)
Patient discharged to home per MD order. Patient in stable condition, and deemed medically cleared by ED provider for discharge. Discharge instructions reviewed with patient/family using "Teach Back"; verbalized understanding of medication education and administration, and information about follow-up care. Denies further concerns. ° °

## 2020-12-24 DIAGNOSIS — Z419 Encounter for procedure for purposes other than remedying health state, unspecified: Secondary | ICD-10-CM | POA: Diagnosis not present

## 2021-01-20 DIAGNOSIS — L0291 Cutaneous abscess, unspecified: Secondary | ICD-10-CM | POA: Diagnosis not present

## 2021-01-20 DIAGNOSIS — K0889 Other specified disorders of teeth and supporting structures: Secondary | ICD-10-CM | POA: Diagnosis not present

## 2021-01-21 DIAGNOSIS — Z419 Encounter for procedure for purposes other than remedying health state, unspecified: Secondary | ICD-10-CM | POA: Diagnosis not present

## 2021-02-21 DIAGNOSIS — Z419 Encounter for procedure for purposes other than remedying health state, unspecified: Secondary | ICD-10-CM | POA: Diagnosis not present

## 2021-03-23 DIAGNOSIS — Z419 Encounter for procedure for purposes other than remedying health state, unspecified: Secondary | ICD-10-CM | POA: Diagnosis not present

## 2021-04-23 DIAGNOSIS — Z419 Encounter for procedure for purposes other than remedying health state, unspecified: Secondary | ICD-10-CM | POA: Diagnosis not present

## 2021-05-23 DIAGNOSIS — Z419 Encounter for procedure for purposes other than remedying health state, unspecified: Secondary | ICD-10-CM | POA: Diagnosis not present

## 2021-06-23 DIAGNOSIS — Z419 Encounter for procedure for purposes other than remedying health state, unspecified: Secondary | ICD-10-CM | POA: Diagnosis not present

## 2021-07-24 DIAGNOSIS — Z419 Encounter for procedure for purposes other than remedying health state, unspecified: Secondary | ICD-10-CM | POA: Diagnosis not present

## 2021-08-15 ENCOUNTER — Emergency Department: Payer: Medicaid Other

## 2021-08-15 ENCOUNTER — Emergency Department
Admission: EM | Admit: 2021-08-15 | Discharge: 2021-08-16 | Disposition: A | Discharge status: Home / Self Care | Payer: Medicaid Other | Attending: Emergency Medicine | Admitting: Emergency Medicine

## 2021-08-15 DIAGNOSIS — F430 Acute stress reaction: Secondary | ICD-10-CM | POA: Diagnosis not present

## 2021-08-15 DIAGNOSIS — S0990XA Unspecified injury of head, initial encounter: Secondary | ICD-10-CM | POA: Diagnosis present

## 2021-08-15 DIAGNOSIS — S199XXA Unspecified injury of neck, initial encounter: Secondary | ICD-10-CM | POA: Diagnosis not present

## 2021-08-15 DIAGNOSIS — Z20822 Contact with and (suspected) exposure to covid-19: Secondary | ICD-10-CM | POA: Insufficient documentation

## 2021-08-15 DIAGNOSIS — F919 Conduct disorder, unspecified: Secondary | ICD-10-CM | POA: Insufficient documentation

## 2021-08-15 DIAGNOSIS — R4689 Other symptoms and signs involving appearance and behavior: Secondary | ICD-10-CM

## 2021-08-15 DIAGNOSIS — X838XXA Intentional self-harm by other specified means, initial encounter: Secondary | ICD-10-CM | POA: Diagnosis not present

## 2021-08-15 DIAGNOSIS — M542 Cervicalgia: Secondary | ICD-10-CM | POA: Insufficient documentation

## 2021-08-15 DIAGNOSIS — F17200 Nicotine dependence, unspecified, uncomplicated: Secondary | ICD-10-CM | POA: Diagnosis not present

## 2021-08-15 DIAGNOSIS — S0003XA Contusion of scalp, initial encounter: Secondary | ICD-10-CM | POA: Diagnosis not present

## 2021-08-15 DIAGNOSIS — Z046 Encounter for general psychiatric examination, requested by authority: Secondary | ICD-10-CM | POA: Insufficient documentation

## 2021-08-15 DIAGNOSIS — F438 Other reactions to severe stress: Secondary | ICD-10-CM | POA: Diagnosis not present

## 2021-08-15 DIAGNOSIS — F418 Other specified anxiety disorders: Secondary | ICD-10-CM | POA: Diagnosis not present

## 2021-08-15 DIAGNOSIS — R4182 Altered mental status, unspecified: Secondary | ICD-10-CM | POA: Diagnosis not present

## 2021-08-15 DIAGNOSIS — Y92149 Unspecified place in prison as the place of occurrence of the external cause: Secondary | ICD-10-CM | POA: Insufficient documentation

## 2021-08-15 LAB — COMPREHENSIVE METABOLIC PANEL
ALT: 19 U/L (ref 0–44)
AST: 22 U/L (ref 15–41)
Albumin: 4.1 g/dL (ref 3.5–5.0)
Alkaline Phosphatase: 63 U/L (ref 38–126)
Anion gap: 6 (ref 5–15)
BUN: 14 mg/dL (ref 6–20)
CO2: 29 mmol/L (ref 22–32)
Calcium: 9.3 mg/dL (ref 8.9–10.3)
Chloride: 102 mmol/L (ref 98–111)
Creatinine, Ser: 1.03 mg/dL (ref 0.61–1.24)
GFR, Estimated: >60 mL/min (ref >60)
Glucose, Bld: 101 mg/dL — ABNORMAL HIGH (ref 70–99)
Potassium: 4.1 mmol/L (ref 3.5–5.1)
Sodium: 137 mmol/L (ref 135–145)
Total Bilirubin: 0.8 mg/dL (ref 0.3–1.2)
Total Protein: 7.2 g/dL (ref 6.5–8.1)

## 2021-08-15 LAB — CBC WITH DIFFERENTIAL/PLATELET
Abs Immature Granulocytes: 0.05 10*3/uL (ref 0.00–0.07)
Basophils Absolute: 0.1 10*3/uL (ref 0.0–0.1)
Basophils Relative: 0 %
Eosinophils Absolute: 0 10*3/uL (ref 0.0–0.5)
Eosinophils Relative: 0 %
HCT: 43.3 % (ref 39.0–52.0)
Hemoglobin: 15.4 g/dL (ref 13.0–17.0)
Immature Granulocytes: 0 %
Lymphocytes Relative: 18 %
Lymphs Abs: 3.1 10*3/uL (ref 0.7–4.0)
MCH: 30.8 pg (ref 26.0–34.0)
MCHC: 35.6 g/dL (ref 30.0–36.0)
MCV: 86.6 fL (ref 80.0–100.0)
Monocytes Absolute: 1.7 10*3/uL — ABNORMAL HIGH (ref 0.1–1.0)
Monocytes Relative: 10 %
Neutro Abs: 12.1 10*3/uL — ABNORMAL HIGH (ref 1.7–7.7)
Neutrophils Relative %: 72 %
Platelets: 332 10*3/uL (ref 150–400)
RBC: 5 MIL/uL (ref 4.22–5.81)
RDW: 11.6 % (ref 11.5–15.5)
WBC: 16.9 10*3/uL — ABNORMAL HIGH (ref 4.0–10.5)
nRBC: 0 % (ref 0.0–0.2)

## 2021-08-15 LAB — SALICYLATE LEVEL: Salicylate Lvl: <7 mg/dL — ABNORMAL LOW (ref 7.0–30.0)

## 2021-08-15 LAB — RESP PANEL BY RT-PCR (FLU A&B, COVID) ARPGX2
Influenza A by PCR: NEGATIVE
Influenza B by PCR: NEGATIVE
SARS Coronavirus 2 by RT PCR: NEGATIVE

## 2021-08-15 LAB — ETHANOL: Alcohol, Ethyl (B): <10 mg/dL (ref <10)

## 2021-08-15 LAB — ACETAMINOPHEN LEVEL: Acetaminophen (Tylenol), Serum: 15 ug/mL (ref 10–30)

## 2021-08-15 MED ORDER — DIPHENHYDRAMINE HCL 50 MG/ML IJ SOLN
50.0000 mg | Freq: Once | INTRAMUSCULAR | Status: AC
  Administered 2021-08-15: 50 mg via INTRAMUSCULAR
  Filled 2021-08-15: qty 1

## 2021-08-15 MED ORDER — HALOPERIDOL LACTATE 5 MG/ML IJ SOLN
5.0000 mg | Freq: Once | INTRAMUSCULAR | Status: AC
  Administered 2021-08-15: 5 mg via INTRAMUSCULAR
  Filled 2021-08-15: qty 1

## 2021-08-15 MED ORDER — ACETAMINOPHEN 500 MG PO TABS
1000.0000 mg | ORAL_TABLET | Freq: Once | ORAL | Status: AC
  Administered 2021-08-15: 1000 mg via ORAL
  Filled 2021-08-15: qty 2

## 2021-08-15 MED ORDER — MIDAZOLAM HCL 2 MG/2ML IJ SOLN
2.0000 mg | Freq: Once | INTRAMUSCULAR | Status: AC
  Administered 2021-08-15: 2 mg via INTRAMUSCULAR
  Filled 2021-08-15: qty 2

## 2021-08-15 MED ORDER — NICOTINE 21 MG/24HR TD PT24
21.0000 mg | MEDICATED_PATCH | Freq: Once | TRANSDERMAL | Status: DC
  Administered 2021-08-15: 21 mg via TRANSDERMAL
  Filled 2021-08-15: qty 1
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
## 2021-08-15 NOTE — ED Notes (Signed)
[facility] PENDING  CONSULT ?
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
## 2021-08-15 NOTE — ED Notes (Signed)
Pt was seated in the bed in hallway, this staff was at the nurses station charting pt jumped up and started running down the hallway.  Staff stated "Hey, where are you going" pt ran and attempted to exit through locked door, pt was unsuccessful he then ran into the ED exit where security was able to stop him.
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
## 2021-08-15 NOTE — BH Assessment (Signed)
This writer attempted to assess pt however pt would not arouse due to being medicated.

## 2021-08-15 NOTE — ED Provider Notes (Signed)
While pending psychiatric evaluation patient did attempt to sprint down the hall and run from facility.  He was brought back to room by security and sedated with IM medications.   Gilles Chiquito, MD 08/15/21 [phone]

## 2021-08-15 NOTE — ED Notes (Signed)
Pt given a turkey sandwich tray and a cup of sprite.  

## 2021-08-15 NOTE — ED Notes (Signed)
IVC  CONSULT  DONE   

## 2021-08-15 NOTE — ED Notes (Signed)
The patient arrived by [facility] , pt has been served IVC paperwork and stated understanding.  There is Security supervision.  Pt stated that he is not SI/HI has no A/V hallucinations.  He denies any prior psych hx. Pt stated that he "only said that so I could get my phone call."

## 2021-08-16 DIAGNOSIS — F430 Acute stress reaction: Secondary | ICD-10-CM | POA: Diagnosis not present

## 2021-08-16 DIAGNOSIS — F330 Major depressive disorder, recurrent, mild: Secondary | ICD-10-CM | POA: Diagnosis not present

## 2021-08-16 MED ORDER — HYDROXYZINE HCL 25 MG PO TABS
50.0000 mg | ORAL_TABLET | Freq: Once | ORAL | Status: AC
  Administered 2021-08-16: 50 mg via ORAL
  Filled 2021-08-16: qty 2

## 2021-08-16 NOTE — ED Provider Notes (Signed)
Emergency Medicine Observation Re-evaluation Note  Devon Thompson is a 30 y.o. male, seen on rounds today.  Pt initially presented to the ED for complaints of Suicidal Currently, the patient is resting, voices no medical complaints.  Physical Exam  BP 122/82 (BP Location: Left Arm)   Pulse 87   Temp 98.2 F (36.8 C) (Oral)   Resp 20   Ht 5\' 7"  (1.702 m)   Wt 77.1 kg   SpO2 100%   BMI 26.63 kg/m  Physical Exam General: Resting in no acute distress Cardiac: No cyanosis Lungs: Equal rise and fall Psych: Not agitated  ED Course / MDM  EKG:   I have reviewed the labs performed to date as well as medications administered while in observation.  Recent changes in the last 24 hours include no changes overnight.  Plan  Current plan is for psychiatric disposition. EDRIS FRIEDT is under involuntary commitment.      Joylene John, MD 08/16/21 [phone]

## 2021-08-16 NOTE — Consult Note (Signed)
[facility] Psych ED Discharge  08/16/2021 12:57 PM Devon Thompson  MRN:  [id number]  Method of visit?: Face to Face   Principal Problem: Stress reaction causing mixed disturbance of emotion and conduct Discharge Diagnoses: Principal Problem:   Stress reaction causing mixed disturbance of emotion and conduct   Subjective: "I'm good, ready to go."  30 yo male presented after being bonded out of jail for threatening self-harm.  Calm and cooperative in the ED.  When he did not get to discharge yesterday, he tried to elope as he felt he was ready to go.  Today, he continues to deny suicidal/homicidal ideations.  No hallucinations or recent drug use.  His mother is still agreeable to have him return to live with her.  Psychiatrically stable for discharge.  Total Time spent with patient: 45 minutes  Past Psychiatric History: substance use, anxiety  Past Medical History: No past medical history on file. No past surgical history on file. Family History: No family history on file. Family Psychiatric  History: none Social History:  Social History   Substance and Sexual Activity  Alcohol Use Yes     Social History   Substance and Sexual Activity  Drug Use Yes    Social History   Socioeconomic History   Marital status: Married    Spouse name: Not on file   Number of children: Not on file   Years of education: Not on file   Highest education level: Not on file  Occupational History   Not on file  Tobacco Use   Smoking status: Every Day   Smokeless tobacco: Never  Substance and Sexual Activity   Alcohol use: Yes   Drug use: Yes   Sexual activity: Not on file  Other Topics Concern   Not on file  Social History Narrative   Not on file   Social Determinants of Health   Financial Resource Strain: Not on file  Food Insecurity: Not on file  Transportation Needs: Not on file  Physical Activity: Not on file  Stress: Not on file  Social Connections: Not on file    Tobacco  Cessation:  A prescription for an FDA-approved tobacco cessation medication was offered at discharge and the patient refused  Current Medications: No current facility-administered medications for this encounter.   No current outpatient medications on file.   PTA Medications: (Not in a hospital admission)   Musculoskeletal: Strength & Muscle Tone: within normal limits Gait & Station: normal Patient leans: N/A  Psychiatric Specialty Exam: Physical Exam Vitals and nursing note reviewed.  Constitutional:      Appearance: Normal appearance.  HENT:     Head: Normocephalic.     Nose: Nose normal.  Pulmonary:     Effort: Pulmonary effort is normal.  Musculoskeletal:        General: Normal range of motion.     Cervical back: Normal range of motion.  Neurological:     General: No focal deficit present.     Mental Status: He is alert and oriented to person, place, and time.  Psychiatric:        Attention and Perception: Attention and perception normal.        Mood and Affect: Mood is anxious.        Speech: Speech normal.        Behavior: Behavior normal. Behavior is cooperative.        Thought Content: Thought content normal.        Cognition and Memory: Cognition and  memory normal.        Judgment: Judgment normal.    Review of Systems  Psychiatric/Behavioral:  The patient is nervous/anxious.   All other systems reviewed and are negative.  Blood pressure (!) 97/55, pulse 86, temperature 98.6 F (37 C), temperature source Oral, resp. rate 17, height 5\' 7"  (1.702 m), weight 77.1 kg, SpO2 99 %.Body mass index is 26.63 kg/m.  General Appearance: Casual  Eye Contact:  Good  Speech:  Normal Rate  Volume:  Normal  Mood:  Anxious  Affect:  Congruent  Thought Process:  Coherent and Descriptions of Associations: Intact  Orientation:  Full (Time, Place, and Person)  Thought Content:  WDL and Logical  Suicidal Thoughts:  No  Homicidal Thoughts:  No  Memory:  Immediate;    Good Recent;   Good Remote;   Good  Judgement:  Fair  Insight:  Fair  Psychomotor Activity:  Normal  Concentration:  Concentration: Good and Attention Span: Good  Recall:  Good  Fund of Knowledge:  Fair  Language:  Good  Akathisia:  No  Handed:  Right  AIMS (if indicated):     Assets:  Housing Leisure Time Physical Health Resilience Social Support  ADL's:  Intact  Cognition:  WNL  Sleep:         Physical Exam: Physical Exam Vitals and nursing note reviewed.  Constitutional:      Appearance: Normal appearance.  HENT:     Head: Normocephalic.     Nose: Nose normal.  Pulmonary:     Effort: Pulmonary effort is normal.  Musculoskeletal:        General: Normal range of motion.     Cervical back: Normal range of motion.  Neurological:     General: No focal deficit present.     Mental Status: He is alert and oriented to person, place, and time.  Psychiatric:        Attention and Perception: Attention and perception normal.        Mood and Affect: Mood is anxious.        Speech: Speech normal.        Behavior: Behavior normal. Behavior is cooperative.        Thought Content: Thought content normal.        Cognition and Memory: Cognition and memory normal.        Judgment: Judgment normal.   Review of Systems  Psychiatric/Behavioral:  The patient is nervous/anxious.   All other systems reviewed and are negative. Blood pressure (!) 97/55, pulse 86, temperature 98.6 F (37 C), temperature source Oral, resp. rate 17, height 5\' 7"  (1.702 m), weight 77.1 kg, SpO2 99 %. Body mass index is 26.63 kg/m.   Demographic Factors:  Male and Caucasian  Loss Factors: Legal issues  Historical Factors: Impulsivity  Risk Reduction Factors:   Sense of responsibility to family, Living with another person, especially a relative, and Positive social support  Continued Clinical Symptoms:  Anxiety, mild  Cognitive Features That Contribute To Risk:  None    Suicide Risk:   Minimal: No identifiable suicidal ideation.  Patients presenting with no risk factors but with morbid ruminations; may be classified as minimal risk based on the severity of the depressive symptoms   Follow-up Information     [facility] Go in 2 day(s).   Contact information: [address] [place] [phone]  Plan Of Care/Follow-up recommendations:  Stress reaction with mixed disturbance of emotions and conduct: -Follow up with anger management in [facility] Activity:  as tolerated  Diet:  heart healthy diet  Disposition: discharge home Nanine Means, NP 08/16/2021, 12:57 PM

## 2021-08-16 NOTE — ED Provider Notes (Signed)
Patient seen by psych NP, IVC revoked, he is stable for discharge.   Georga Hacking, MD 08/16/21 1106

## 2021-08-16 NOTE — ED Notes (Signed)
IVC pending reassessment 

## 2021-08-18 ENCOUNTER — Telehealth: Payer: Self-pay

## 2021-08-18 NOTE — Telephone Encounter (Signed)
Transition Care Management Unsuccessful Follow-up Telephone Call  Date of discharge and from where:  08/16/2021 from [facility]  Attempts:  1st Attempt  Reason for unsuccessful TCM follow-up call:  Left voice message

## 2021-08-23 DIAGNOSIS — Z419 Encounter for procedure for purposes other than remedying health state, unspecified: Secondary | ICD-10-CM | POA: Diagnosis not present

## 2021-08-25 NOTE — Telephone Encounter (Signed)
Transition Care Management Unsuccessful Follow-up Telephone Call  Date of discharge and from where:  08/16/2021 from [facility]  Attempts:  2nd Attempt  Reason for unsuccessful TCM follow-up call:  Left voice message

## 2021-08-26 NOTE — Telephone Encounter (Signed)
Transition Care Management Unsuccessful Follow-up Telephone Call  Date of discharge and from where:  08/16/2021 from [facility]  Attempts:  3rd Attempt  Reason for unsuccessful TCM follow-up call:  Unable to reach patient

## 2021-11-14 ENCOUNTER — Emergency Department

## 2021-11-14 ENCOUNTER — Other Ambulatory Visit: Payer: Self-pay

## 2021-11-14 ENCOUNTER — Emergency Department
Admission: EM | Admit: 2021-11-14 | Discharge: 2021-11-15 | Attending: Emergency Medicine | Admitting: Emergency Medicine

## 2021-11-14 DIAGNOSIS — F430 Acute stress reaction: Secondary | ICD-10-CM | POA: Diagnosis present

## 2021-11-14 DIAGNOSIS — T71162A Asphyxiation due to hanging, intentional self-harm, initial encounter: Secondary | ICD-10-CM | POA: Diagnosis not present

## 2021-11-14 DIAGNOSIS — R000 Tachycardia, unspecified: Secondary | ICD-10-CM | POA: Diagnosis not present

## 2021-11-14 DIAGNOSIS — F17290 Nicotine dependence, other tobacco product, uncomplicated: Secondary | ICD-10-CM | POA: Insufficient documentation

## 2021-11-14 DIAGNOSIS — Z20822 Contact with and (suspected) exposure to covid-19: Secondary | ICD-10-CM | POA: Insufficient documentation

## 2021-11-14 DIAGNOSIS — F4389 Other reactions to severe stress: Secondary | ICD-10-CM | POA: Diagnosis not present

## 2021-11-14 DIAGNOSIS — S199XXA Unspecified injury of neck, initial encounter: Secondary | ICD-10-CM | POA: Diagnosis not present

## 2021-11-14 DIAGNOSIS — K029 Dental caries, unspecified: Secondary | ICD-10-CM | POA: Diagnosis not present

## 2021-11-14 LAB — CBC WITH DIFFERENTIAL/PLATELET
Abs Immature Granulocytes: 0.02 10*3/uL (ref 0.00–0.07)
Basophils Absolute: 0 10*3/uL (ref 0.0–0.1)
Basophils Relative: 0 %
Eosinophils Absolute: 0 10*3/uL (ref 0.0–0.5)
Eosinophils Relative: 0 %
HCT: 45.3 % (ref 39.0–52.0)
Hemoglobin: 16 g/dL (ref 13.0–17.0)
Immature Granulocytes: 0 %
Lymphocytes Relative: 24 %
Lymphs Abs: 2.4 10*3/uL (ref 0.7–4.0)
MCH: 29.6 pg (ref 26.0–34.0)
MCHC: 35.3 g/dL (ref 30.0–36.0)
MCV: 83.9 fL (ref 80.0–100.0)
Monocytes Absolute: 1.1 10*3/uL — ABNORMAL HIGH (ref 0.1–1.0)
Monocytes Relative: 12 %
Neutro Abs: 6.1 10*3/uL (ref 1.7–7.7)
Neutrophils Relative %: 64 %
Platelets: 293 10*3/uL (ref 150–400)
RBC: 5.4 MIL/uL (ref 4.22–5.81)
RDW: 11.6 % (ref 11.5–15.5)
WBC: 9.7 10*3/uL (ref 4.0–10.5)
nRBC: 0 % (ref 0.0–0.2)

## 2021-11-14 LAB — COMPREHENSIVE METABOLIC PANEL
ALT: 22 U/L (ref 0–44)
AST: 19 U/L (ref 15–41)
Albumin: 4.4 g/dL (ref 3.5–5.0)
Alkaline Phosphatase: 76 U/L (ref 38–126)
Anion gap: 4 — ABNORMAL LOW (ref 5–15)
BUN: 15 mg/dL (ref 6–20)
CO2: 26 mmol/L (ref 22–32)
Calcium: 9.3 mg/dL (ref 8.9–10.3)
Chloride: 103 mmol/L (ref 98–111)
Creatinine, Ser: 0.87 mg/dL (ref 0.61–1.24)
GFR, Estimated: >60 mL/min (ref >60)
Glucose, Bld: 107 mg/dL — ABNORMAL HIGH (ref 70–99)
Potassium: 3.6 mmol/L (ref 3.5–5.1)
Sodium: 133 mmol/L — ABNORMAL LOW (ref 135–145)
Total Bilirubin: 0.9 mg/dL (ref 0.3–1.2)
Total Protein: 7.8 g/dL (ref 6.5–8.1)

## 2021-11-14 LAB — RESP PANEL BY RT-PCR (FLU A&B, COVID) ARPGX2
Influenza A by PCR: NEGATIVE
Influenza B by PCR: NEGATIVE
SARS Coronavirus 2 by RT PCR: NEGATIVE

## 2021-11-14 LAB — SALICYLATE LEVEL: Salicylate Lvl: <7 mg/dL — ABNORMAL LOW (ref 7.0–30.0)

## 2021-11-14 LAB — TROPONIN I (HIGH SENSITIVITY): Troponin I (High Sensitivity): 3 ng/L (ref <18)

## 2021-11-14 LAB — ACETAMINOPHEN LEVEL: Acetaminophen (Tylenol), Serum: <10 ug/mL — ABNORMAL LOW (ref 10–30)

## 2021-11-14 MED ORDER — IOHEXOL 350 MG/ML SOLN
75.0000 mL | Freq: Once | INTRAVENOUS | Status: AC | PRN
  Administered 2021-11-14: 19:00:00 75 mL via INTRAVENOUS

## 2021-11-14 NOTE — ED Triage Notes (Signed)
Pt to ED via [facility] from jail in handcuffs accompanied by officer. Pt had suicidal attempt by hanging himself with a blanket. Pt responsive to painful stimuli on arrival. VSS in route. C-collar in place by EMS.

## 2021-11-14 NOTE — ED Notes (Addendum)
Patient transported to CT via wheelchair with officer
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
## 2021-11-14 NOTE — ED Provider Notes (Signed)
[facility] Emergency Department Provider Note   ____________________________________________   Event Date/Time   First MD Initiated Contact with Patient 11/14/21 1742     (approximate)  I have reviewed the triage vital signs and the nursing notes.   HISTORY  Chief Complaint Suicide Attempt    HPI Devon Thompson is a 30 y.o. male who presents via EMS due to reports of an attempted hanging.  Per EMS report, patient was found hanging from a bedsheet earlier today with unknown strangulation time.  EMS also note bystander CPR prior to their arrival and finding that patient was breathing on his own.  Per EMS report, patient has not required any supplemental oxygen nor did they appreciate any stridor on exam or respiratory distress throughout their transport.  Patient is refusing to speak to staff at this time but does arrive wearing a c-collar.  Further history and review of systems are unable to be assessed at this time         History reviewed. No pertinent past medical history.  Patient Active Problem List   Diagnosis Date Noted   Stress reaction causing mixed disturbance of emotion and conduct 08/15/2021    History reviewed. No pertinent surgical history.  Prior to Admission medications   Not on File    Allergies Amoxicillin and Penicillins  History reviewed. No pertinent family history.  Social History Social History   Tobacco Use   Smoking status: Every Day   Smokeless tobacco: Never  Substance Use Topics   Alcohol use: Yes   Drug use: Yes    Review of Systems Unable to assess ____________________________________________   PHYSICAL EXAM:  VITAL SIGNS: ED Triage Vitals  Enc Vitals Group     BP      Pulse      Resp      Temp      Temp src      SpO2      Weight      Height      Head Circumference      Peak Flow      Pain Score      Pain Loc      Pain Edu?      Excl. in GC?    Constitutional: Awake and alert.  Well appearing adult Caucasian male in no acute distress laying on stretcher with c-collar in place Eyes: Conjunctivae are normal. PERRL. Head: Atraumatic. Nose: No congestion/rhinnorhea. Mouth/Throat: Mucous membranes are moist. Neck: No stridor.  Small amount of of erythema to the bilateral neck overlying SCM's Cardiovascular: Grossly normal heart sounds.  Good peripheral circulation. Respiratory: Normal respiratory effort.  No retractions. Gastrointestinal: Soft and nontender. No distention. Musculoskeletal: No obvious deformities Neurologic:  MAES Skin:  Skin is warm and dry. No rash noted. Psychiatric: Cooperative  ____________________________________________   LABS (all labs ordered are listed, but only abnormal results are displayed)  Labs Reviewed  ACETAMINOPHEN LEVEL - Abnormal; Notable for the following components:      Result Value   Acetaminophen (Tylenol), Serum <10 (*)    All other components within normal limits  SALICYLATE LEVEL - Abnormal; Notable for the following components:   Salicylate Lvl <7.0 (*)    All other components within normal limits  COMPREHENSIVE METABOLIC PANEL - Abnormal; Notable for the following components:   Sodium 133 (*)    Glucose, Bld 107 (*)    Anion gap 4 (*)    All other components within normal limits  CBC WITH  DIFFERENTIAL/PLATELET - Abnormal; Notable for the following components:   Monocytes Absolute 1.1 (*)    All other components within normal limits  RESP PANEL BY RT-PCR (FLU A&B, COVID) [id number]  TROPONIN I (HIGH SENSITIVITY)  TROPONIN I (HIGH SENSITIVITY)   ____________________________________________  EKG  ED ECG REPORT I, Merwyn Katos, the attending physician, personally viewed and interpreted this ECG.  Date: 11/14/2021 EKG Time: 1749 Rate: 103 Rhythm: Tachycardic sinus rhythm QRS Axis: normal Intervals: normal ST/T Wave abnormalities: normal Narrative Interpretation: Tachycardic sinus rhythm.  No evidence of  acute ischemia  ____________________________________________  RADIOLOGY  ED MD interpretation: CT angiography of the neck shows no evidence of acute arterial injury, cervical spinal fracture/dislocation, or any other acute abnormalities  Official radiology report(s): CT Angio Neck W and/or Wo Contrast  Result Date: 11/14/2021 CLINICAL DATA:  Attempted hanging.  Rule out arterial injury. EXAM: CT ANGIOGRAPHY NECK TECHNIQUE: Multidetector CT imaging of the neck was performed using the standard protocol during bolus administration of intravenous contrast. Multiplanar CT image reconstructions and MIPs were obtained to evaluate the vascular anatomy. Carotid stenosis measurements (when applicable) are obtained utilizing NASCET criteria, using the distal internal carotid diameter as the denominator. CONTRAST:  33mL OMNIPAQUE IOHEXOL 350 MG/ML SOLN COMPARISON:  None. FINDINGS: Aortic arch: Normal aortic arch. Left vertebral artery origin from the arch. Proximal great vessels widely patent. Right carotid system: Normal right carotid system. No vascular injury Left carotid system: Normal left carotid system.  No vascular injury Vertebral arteries: Normal vertebral arteries bilaterally. No vascular injury. Skeleton: Negative for cervical spine fracture. Chronic fracture right clavicle Poor dentition with numerous caries. Other neck: No soft tissue mass or hematoma in the neck. Upper chest: Lung apices clear bilaterally. IMPRESSION: 1. Negative CTA neck.  No arterial injury in the neck 2. Negative for cervical spine fracture 3. Poor dentition with numerous caries Electronically Signed   By: Marlan Palau M.D.   On: 11/14/2021 18:59    ____________________________________________   PROCEDURES  Procedure(s) performed (including Critical Care):  Procedures   ____________________________________________   INITIAL IMPRESSION / ASSESSMENT AND PLAN / ED COURSE  As part of my medical decision making, I  reviewed the following data within the electronic medical record, if available:  Nursing notes reviewed and incorporated, Labs reviewed, EKG interpreted, Old chart reviewed, Radiograph reviewed and Notes from prior ED visits reviewed and incorporated      Patient continues to refuse to speak to staff about the nature of this injury or how he was found and at this point we will see him patient was attempting to end his life and will require psychiatric evaluation prior to disposition Prior suicide attempt by hanging Prior Psychiatric Hospitalizations: None  Clinically patient displays no overt toxidrome; they are well appearing, with low suspicion for toxic ingestion given history and exam. Thoughts unlikely 2/2 anemia, hypothyroidism, infection, or ICH.  Consult: Psychiatry to evaluate patient for potential hold for danger to self. Disposition: Care of this patient will be signed out to the oncoming physician at the end of my shift.  All pertinent patient information conveyed and all questions answered.  All further care and disposition decisions will be made by the oncoming physician.      ____________________________________________   FINAL CLINICAL IMPRESSION(S) / ED DIAGNOSES  Final diagnoses:  Suicide attempt by hanging, initial encounter [facility])     ED Discharge Orders     None        Note:  This document was prepared using  Dragon Chemical engineer and may include unintentional dictation errors.    Merwyn Katos, MD 11/14/21 [phone]

## 2021-11-15 DIAGNOSIS — F430 Acute stress reaction: Secondary | ICD-10-CM

## 2021-11-15 MED ORDER — FLUOXETINE HCL 20 MG PO CAPS
20.0000 mg | ORAL_CAPSULE | Freq: Every day | ORAL | 1 refills | Status: DC
Start: 2021-11-15 — End: 2023-10-12

## 2021-11-15 MED ORDER — RISPERIDONE 0.5 MG PO TABS: 0.5000 mg | ORAL_TABLET | Freq: Two times a day (BID) | ORAL | 1 refills | Status: DC

## 2021-11-15 MED ORDER — FLUOXETINE HCL 20 MG PO CAPS: 20.0000 mg | ORAL_CAPSULE | Freq: Every day | ORAL | 3 refills | Status: DC

## 2021-11-15 MED ORDER — FLUOXETINE HCL 20 MG PO CAPS: 20.0000 mg | ORAL_CAPSULE | Freq: Every day | ORAL | Status: DC

## 2021-11-15 MED ORDER — RISPERIDONE 0.5 MG PO TABS: 0.5000 mg | ORAL_TABLET | Freq: Two times a day (BID) | ORAL | 2 refills | Status: DC

## 2021-11-15 MED ORDER — RISPERIDONE 1 MG PO TABS: 0.5000 mg | ORAL_TABLET | Freq: Two times a day (BID) | ORAL | Status: DC

## 2021-11-15 MED ORDER — FLUOXETINE HCL 20 MG PO CAPS: 20.0000 mg | ORAL_CAPSULE | Freq: Every day | ORAL | 2 refills | Status: DC

## 2021-11-15 NOTE — ED Provider Notes (Signed)
----------------------------------------- °  8:50 AM on 11/15/2021 -----------------------------------------  Patient has been evaluated by APP Lord from psychiatry and cleared for discharge.  He is not under involuntary commitment.  He has been prescribed new medications.  He will be discharged back to jail.  Return precautions provided.   Dionne Bucy, MD 11/15/21 [phone]

## 2021-11-15 NOTE — Consult Note (Signed)
[facility] Face-to-Face Psychiatry Consult   Reason for Consult:  attempted hanging in jail Referring Physician:  EDP Patient Identification: Devon Thompson MRN:  [id number] Principal Diagnosis: Stress reaction causing mixed disturbance of emotion and conduct Diagnosis:  Principal Problem:   Stress reaction causing mixed disturbance of emotion and conduct   Total Time spent with patient: 1 hour  Subjective:   Devon Thompson is a 30 y.o. male patient admitted with attempted hanging in jail, history of substance abuse.  Caveat: This provider evaluated the client on 9/24 after he threatened suicide when he was denied bond.  On assessment, he denied everything and wanted to leave as he was then granted bond after threatening suicidal ideations.  HPI:  30 yo male presents from jail after placing a blanket around his neck in an attempt to hang himself.  He has several felony drug charges along with selling drug charges.  Today on assessment, he endorses depression, started Prozac to assist along with Risperdal to assist with irritability and any possible hallucinations.  He reported on admission he was hearing voices, not responding on assessment.  Intermittent suicidal ideations at times, none currently.  Discussed with the officer suicide watch and avoidance of any blankets, sheets, or other ligatures along with a turtle suit.  They do have this ability in jail and will continue his medications and mental health care at their facility.  Medically cleared and psychiatrically stable to return to be monitored in jail on suicide watch with a turtle suit in place.  Past Psychiatric History: none  Risk to Self:  none currently Risk to Others: none Prior Inpatient Therapy: none Prior Outpatient Therapy: none  Past Medical History: History reviewed. No pertinent past medical history. History reviewed. No pertinent surgical history. Family History: History reviewed. No pertinent family history. Family  Psychiatric  History: none Social History:  Social History   Substance and Sexual Activity  Alcohol Use Yes     Social History   Substance and Sexual Activity  Drug Use Yes    Social History   Socioeconomic History   Marital status: Married    Spouse name: Not on file   Number of children: Not on file   Years of education: Not on file   Highest education level: Not on file  Occupational History   Not on file  Tobacco Use   Smoking status: Every Day   Smokeless tobacco: Never  Substance and Sexual Activity   Alcohol use: Yes   Drug use: Yes   Sexual activity: Not on file  Other Topics Concern   Not on file  Social History Narrative   Not on file   Social Determinants of Health   Financial Resource Strain: Not on file  Food Insecurity: Not on file  Transportation Needs: Not on file  Physical Activity: Not on file  Stress: Not on file  Social Connections: Not on file   Additional Social History:    Allergies:   Allergies  Allergen Reactions   Amoxicillin Swelling   Penicillins Swelling    Labs:  Results for orders placed or performed during the hospital encounter of 11/14/21 (from the past 48 hour(s))  Acetaminophen level     Status: Abnormal   Collection Time: 11/14/21  5:48 PM  Result Value Ref Range   Acetaminophen (Tylenol), Serum <10 (L) 10 - 30 ug/mL    Comment: (NOTE) Therapeutic concentrations vary significantly. A range of 10-30 ug/mL  may be an effective concentration for many patients.  However, some  are best treated at concentrations outside of this range. Acetaminophen concentrations >150 ug/mL at 4 hours after ingestion  and >50 ug/mL at 12 hours after ingestion are often associated with  toxic reactions.  Performed at [facility], [address] [place], [place] [address]   Troponin I (High Sensitivity)     Status: None   Collection Time: 11/14/21  5:48 PM  Result Value Ref Range   Troponin I (High Sensitivity) 3 <18  ng/L    Comment: (NOTE) Elevated high sensitivity troponin I (hsTnI) values and significant  changes across serial measurements may suggest ACS but many other  chronic and acute conditions are known to elevate hsTnI results.  Refer to the "Links" section for chest pain algorithms and additional  guidance. Performed at [facility], [address] [place], [place] [address]   Salicylate level     Status: Abnormal   Collection Time: 11/14/21  5:48 PM  Result Value Ref Range   Salicylate Lvl <7.0 (L) 7.0 - 30.0 mg/dL    Comment: Performed at [facility], [address] [place], [place] [address]  Comprehensive metabolic panel     Status: Abnormal   Collection Time: 11/14/21  5:48 PM  Result Value Ref Range   Sodium 133 (L) 135 - 145 mmol/L   Potassium 3.6 3.5 - 5.1 mmol/L   Chloride 103 98 - 111 mmol/L   CO2 26 22 - 32 mmol/L   Glucose, Bld 107 (H) 70 - 99 mg/dL    Comment: Glucose reference range applies only to samples taken after fasting for at least 8 hours.   BUN 15 6 - 20 mg/dL   Creatinine, Ser 9.14 0.61 - 1.24 mg/dL   Calcium 9.3 8.9 - 78.2 mg/dL   Total Protein 7.8 6.5 - 8.1 g/dL   Albumin 4.4 3.5 - 5.0 g/dL   AST 19 15 - 41 U/L   ALT 22 0 - 44 U/L   Alkaline Phosphatase 76 38 - 126 U/L   Total Bilirubin 0.9 0.3 - 1.2 mg/dL   GFR, Estimated >95 >62 mL/min    Comment: (NOTE) Calculated using the CKD-EPI Creatinine Equation (2021)    Anion gap 4 (L) 5 - 15    Comment: Performed at [facility], [address] [place], [place] [address]  CBC with Differential     Status: Abnormal   Collection Time: 11/14/21  5:48 PM  Result Value Ref Range   WBC 9.7 4.0 - 10.5 K/uL   RBC 5.40 4.22 - 5.81 MIL/uL   Hemoglobin 16.0 13.0 - 17.0 g/dL   HCT 57.8 46.9 - 62.9 %   MCV 83.9 80.0 - 100.0 fL   MCH 29.6 26.0 - 34.0 pg   MCHC 35.3 30.0 - 36.0 g/dL   RDW 52.8 41.3 - 24.4 %   Platelets 293 150 - 400 K/uL   nRBC 0.0 0.0 - 0.2 %   Neutrophils  Relative % 64 %   Neutro Abs 6.1 1.7 - 7.7 K/uL   Lymphocytes Relative 24 %   Lymphs Abs 2.4 0.7 - 4.0 K/uL   Monocytes Relative 12 %   Monocytes Absolute 1.1 (H) 0.1 - 1.0 K/uL   Eosinophils Relative 0 %   Eosinophils Absolute 0.0 0.0 - 0.5 K/uL   Basophils Relative 0 %   Basophils Absolute 0.0 0.0 - 0.1 K/uL   Immature Granulocytes 0 %   Abs Immature Granulocytes 0.02 0.00 - 0.07 K/uL  Comment: Performed at [facility], [address] [place], [place] [address]  Resp Panel by RT-PCR (Flu A&B, Covid) Nasopharyngeal Swab     Status: None   Collection Time: 11/14/21  7:14 PM   Specimen: Nasopharyngeal Swab; Nasopharyngeal(NP) swabs in vial transport medium  Result Value Ref Range   SARS Coronavirus 2 by RT PCR NEGATIVE NEGATIVE    Comment: (NOTE) SARS-CoV-2 target nucleic acids are NOT DETECTED.  The SARS-CoV-2 RNA is generally detectable in upper respiratory specimens during the acute phase of infection. The lowest concentration of SARS-CoV-2 viral copies this assay can detect is 138 copies/mL. A negative result does not preclude SARS-Cov-2 infection and should not be used as the sole basis for treatment or other patient management decisions. A negative result may occur with  improper specimen collection/handling, submission of specimen other than nasopharyngeal swab, presence of viral mutation(s) within the areas targeted by this assay, and inadequate number of viral copies(<138 copies/mL). A negative result must be combined with clinical observations, patient history, and epidemiological information. The expected result is Negative.  Fact Sheet for Patients:  BloggerCourse.com  Fact Sheet for Healthcare Providers:  SeriousBroker.it  This test is no t yet approved or cleared by the [place] FDA and  has been authorized for detection and/or diagnosis of SARS-CoV-2 by FDA under an Emergency Use Authorization  (EUA). This EUA will remain  in effect (meaning this test can be used) for the duration of the COVID-19 declaration under Section 564(b)(1) of the Act, 21 U.S.C.section 360bbb-3(b)(1), unless the authorization is terminated  or revoked sooner.       Influenza A by PCR NEGATIVE NEGATIVE   Influenza B by PCR NEGATIVE NEGATIVE    Comment: (NOTE) The Xpert Xpress SARS-CoV-2/FLU/RSV plus assay is intended as an aid in the diagnosis of influenza from Nasopharyngeal swab specimens and should not be used as a sole basis for treatment. Nasal washings and aspirates are unacceptable for Xpert Xpress SARS-CoV-2/FLU/RSV testing.  Fact Sheet for Patients: BloggerCourse.com  Fact Sheet for Healthcare Providers: SeriousBroker.it  This test is not yet approved or cleared by the [place] FDA and has been authorized for detection and/or diagnosis of SARS-CoV-2 by FDA under an Emergency Use Authorization (EUA). This EUA will remain in effect (meaning this test can be used) for the duration of the COVID-19 declaration under Section 564(b)(1) of the Act, 21 U.S.C. section 360bbb-3(b)(1), unless the authorization is terminated or revoked.  Performed at [facility], [address] [place], [place] [address]     Current Facility-Administered Medications  Medication Dose Route Frequency Provider Last Rate Last Admin   FLUoxetine (PROZAC) capsule 20 mg  20 mg Oral Daily Charm Rings, NP       risperiDONE (RISPERDAL) tablet 0.5 mg  0.5 mg Oral BID Charm Rings, NP       No current outpatient medications on file.    Musculoskeletal: Strength & Muscle Tone: within normal limits Gait & Station: normal Patient leans: N/A  Psychiatric Specialty Exam: Physical Exam Vitals and nursing note reviewed.  Constitutional:      Appearance: Normal appearance.  HENT:     Head: Normocephalic.     Nose: Nose normal.  Pulmonary:      Effort: Pulmonary effort is normal.  Musculoskeletal:        General: Normal range of motion.     Cervical back: Normal range of motion.  Neurological:     General: No focal deficit present.  Mental Status: He is alert and oriented to person, place, and time.  Psychiatric:        Attention and Perception: Attention and perception normal.        Mood and Affect: Mood is anxious and depressed.        Speech: Speech normal.        Behavior: Behavior normal. Behavior is cooperative.        Thought Content: Thought content normal.        Cognition and Memory: Cognition normal.        Judgment: Judgment is impulsive.    Review of Systems  Psychiatric/Behavioral:  Positive for depression. The patient is nervous/anxious.   All other systems reviewed and are negative.  Blood pressure 104/68, pulse (!) 101, temperature 98.6 F (37 C), resp. rate 18, height 5\' 7"  (1.702 m), weight 77.1 kg, SpO2 100 %.Body mass index is 26.62 kg/m.  General Appearance: Casual  Eye Contact:  Good  Speech:  Normal Rate  Volume:  Normal  Mood:  Anxious and Depressed  Affect:  Congruent  Thought Process:  Coherent and Descriptions of Associations: Intact  Orientation:  Full (Time, Place, and Person)  Thought Content:  WDL and Logical  Suicidal Thoughts:  No  Homicidal Thoughts:  No  Memory:  Immediate;   Good Recent;   Good Remote;   Good  Judgement:  Fair  Insight:  Fair  Psychomotor Activity:  Normal  Concentration:  Concentration: Good and Attention Span: Good  Recall:  Good  Fund of Knowledge:  Good  Language:  Good  Akathisia:  No  Handed:  Right  AIMS (if indicated):     Assets:  Housing Leisure Time Physical Health Resilience  ADL's:  Intact  Cognition:  WNL  Sleep:        Physical Exam: Physical Exam Vitals and nursing note reviewed.  Constitutional:      Appearance: Normal appearance.  HENT:     Head: Normocephalic.     Nose: Nose normal.  Pulmonary:     Effort: Pulmonary  effort is normal.  Musculoskeletal:        General: Normal range of motion.     Cervical back: Normal range of motion.  Neurological:     General: No focal deficit present.     Mental Status: He is alert and oriented to person, place, and time.  Psychiatric:        Attention and Perception: Attention and perception normal.        Mood and Affect: Mood is anxious and depressed.        Speech: Speech normal.        Behavior: Behavior normal. Behavior is cooperative.        Thought Content: Thought content normal.        Cognition and Memory: Cognition normal.        Judgment: Judgment is impulsive.   Review of Systems  Psychiatric/Behavioral:  Positive for depression. The patient is nervous/anxious.   All other systems reviewed and are negative. Blood pressure 104/68, pulse (!) 101, temperature 98.6 F (37 C), resp. rate 18, height 5\' 7"  (1.702 m), weight 77.1 kg, SpO2 100 %. Body mass index is 26.62 kg/m.  Treatment Plan Summary: Stress reaction with mixed disturbance of emotions and conduct: -Started Prozac 20 mg daily for depression and anxieyt -Started Risperdal 0.5 mg BID for irritability -mental health care at jail -place on suicide watch in jail with a turtle suit and avoidance of  all blankets, sheets, and other ligatures along with other self-harm devices  Disposition: No evidence of imminent risk to self or others at present.   Patient does not meet criteria for psychiatric inpatient admission.  Nanine Means, NP 11/15/2021 7:11 AM

## 2021-11-15 NOTE — Discharge Instructions (Signed)
Take the new medications as prescribed.  Follow-up with your regular psychiatrist.  Return to the ER for any new, worsening, or recurrent thoughts of wanting to harm yourself or anyone else, or any other new or worsening symptoms that concern you.

## 2021-11-15 NOTE — ED Notes (Signed)
E-signature not working at this time. Pt verbalized understanding of D/C instructions, prescriptions and follow up care with no further questions at this time. Pt in NAD and ambulatory at time of D/C. Pt in custody at time of departure

## 2021-11-15 NOTE — ED Notes (Signed)
Pt in Custody of LEO who remain with patient/ Psych Consult ordered/Pending

## 2021-11-15 NOTE — ED Provider Notes (Signed)
Emergency Medicine Observation Re-evaluation Note  JASH WAHLEN is a 30 y.o. male, seen on rounds today.  Pt initially presented to the ED for complaints of Suicide Attempt Currently, the patient is resting comfortably.  Physical Exam  BP 104/68 (BP Location: Left Arm)    Pulse (!) 101    Temp 98.6 F (37 C)    Resp 18    Ht 5\' 7"  (1.702 m)    Wt 77.1 kg    SpO2 100%    BMI 26.62 kg/m  Physical Exam Gen: No acute distress  Resp: Normal rise and fall of chest Neuro: Moving all four extremities Psych: Resting currently, calm and cooperative when awake    ED Course / MDM  EKG:   I have reviewed the labs performed to date as well as medications administered while in observation.  Recent changes in the last 24 hours include no acute events overnight.  Plan  Current plan is for psychiatric evaluation for further disposition.  JONANTHONY NAHAR is not under involuntary commitment.     Khiana Camino, Joylene John, DO 11/15/21 [phone]

## 2021-11-15 NOTE — ED Notes (Signed)
Copy of prescriptions faxed to number given by [employer] detention [facility].

## 2021-11-16 ENCOUNTER — Encounter: Payer: Self-pay | Admitting: Emergency Medicine

## 2021-11-16 ENCOUNTER — Other Ambulatory Visit: Payer: Self-pay

## 2021-11-16 ENCOUNTER — Emergency Department

## 2021-11-16 ENCOUNTER — Emergency Department
Admission: EM | Admit: 2021-11-16 | Discharge: 2021-11-17 | Disposition: A | Discharge status: Home / Self Care | Attending: Emergency Medicine | Admitting: Emergency Medicine

## 2021-11-16 DIAGNOSIS — U071 COVID-19: Secondary | ICD-10-CM | POA: Insufficient documentation

## 2021-11-16 DIAGNOSIS — T71162A Asphyxiation due to hanging, intentional self-harm, initial encounter: Secondary | ICD-10-CM | POA: Diagnosis not present

## 2021-11-16 DIAGNOSIS — R918 Other nonspecific abnormal finding of lung field: Secondary | ICD-10-CM | POA: Diagnosis not present

## 2021-11-16 DIAGNOSIS — F17200 Nicotine dependence, unspecified, uncomplicated: Secondary | ICD-10-CM | POA: Diagnosis not present

## 2021-11-16 DIAGNOSIS — X838XXA Intentional self-harm by other specified means, initial encounter: Secondary | ICD-10-CM | POA: Insufficient documentation

## 2021-11-16 DIAGNOSIS — F430 Acute stress reaction: Secondary | ICD-10-CM | POA: Diagnosis not present

## 2021-11-16 LAB — CBC WITH DIFFERENTIAL/PLATELET
Abs Immature Granulocytes: 0.03 10*3/uL (ref 0.00–0.07)
Basophils Absolute: 0.1 10*3/uL (ref 0.0–0.1)
Basophils Relative: 0 %
Eosinophils Absolute: 0.1 10*3/uL (ref 0.0–0.5)
Eosinophils Relative: 1 %
HCT: 44.1 % (ref 39.0–52.0)
Hemoglobin: 15.6 g/dL (ref 13.0–17.0)
Immature Granulocytes: 0 %
Lymphocytes Relative: 25 %
Lymphs Abs: 2.9 10*3/uL (ref 0.7–4.0)
MCH: 30.2 pg (ref 26.0–34.0)
MCHC: 35.4 g/dL (ref 30.0–36.0)
MCV: 85.3 fL (ref 80.0–100.0)
Monocytes Absolute: 1.5 10*3/uL — ABNORMAL HIGH (ref 0.1–1.0)
Monocytes Relative: 13 %
Neutro Abs: 6.9 10*3/uL (ref 1.7–7.7)
Neutrophils Relative %: 61 %
Platelets: 308 10*3/uL (ref 150–400)
RBC: 5.17 MIL/uL (ref 4.22–5.81)
RDW: 11.7 % (ref 11.5–15.5)
WBC: 11.5 10*3/uL — ABNORMAL HIGH (ref 4.0–10.5)
nRBC: 0 % (ref 0.0–0.2)

## 2021-11-16 LAB — COMPREHENSIVE METABOLIC PANEL
ALT: 20 U/L (ref 0–44)
AST: 18 U/L (ref 15–41)
Albumin: 4.1 g/dL (ref 3.5–5.0)
Alkaline Phosphatase: 69 U/L (ref 38–126)
Anion gap: 7 (ref 5–15)
BUN: 13 mg/dL (ref 6–20)
CO2: 28 mmol/L (ref 22–32)
Calcium: 9.6 mg/dL (ref 8.9–10.3)
Chloride: 100 mmol/L (ref 98–111)
Creatinine, Ser: 0.98 mg/dL (ref 0.61–1.24)
GFR, Estimated: >60 mL/min (ref >60)
Glucose, Bld: 115 mg/dL — ABNORMAL HIGH (ref 70–99)
Potassium: 4 mmol/L (ref 3.5–5.1)
Sodium: 135 mmol/L (ref 135–145)
Total Bilirubin: 0.7 mg/dL (ref 0.3–1.2)
Total Protein: 7.5 g/dL (ref 6.5–8.1)

## 2021-11-16 LAB — RESP PANEL BY RT-PCR (FLU A&B, COVID) ARPGX2
Influenza A by PCR: NEGATIVE
Influenza B by PCR: NEGATIVE
SARS Coronavirus 2 by RT PCR: POSITIVE — AB

## 2021-11-16 LAB — SALICYLATE LEVEL: Salicylate Lvl: <7 mg/dL — ABNORMAL LOW (ref 7.0–30.0)

## 2021-11-16 LAB — ACETAMINOPHEN LEVEL: Acetaminophen (Tylenol), Serum: <10 ug/mL — ABNORMAL LOW (ref 10–30)

## 2021-11-16 LAB — ETHANOL: Alcohol, Ethyl (B): <10 mg/dL (ref <10)

## 2021-11-16 NOTE — ED Notes (Signed)
Patient reports attempted to hang himself earlier in jail with a blanket.  States he was just set he was in jail on Christmas and was missing his kids.  Patients denies continues SI at this time.  No bruising or marks noted on neck.  Patient with red swollen area at right clavicle area, MD aware.  Patient's hand cuffs removed by [facility], leg shackles remain with continue to monitor skin and circulation.

## 2021-11-16 NOTE — ED Triage Notes (Signed)
Pt to ED via [facility] from jail c/o SI in custody with sheriff.  States tried hanging self in jail with his clothes, denies HI or A/V hallucinations.  States has hx of suicidal attempts.  Pt brought in wearing paper clothing from jail for issue of using his clothes to hang self.  Patient put into paper scrub bottoms, in wrist and ankle handcuffs with sheriff, color and circulation WNL.

## 2021-11-16 NOTE — ED Notes (Signed)
Prisoner pending consult

## 2021-11-16 NOTE — ED Provider Notes (Signed)
[facility] Emergency Department Provider Note ____________________________________________   Event Date/Time   First MD Initiated Contact with Patient 11/16/21 2039     (approximate)  I have reviewed the triage vital signs and the nursing notes.  HISTORY  Chief Complaint Suicidal   HPI Devon Thompson is a 30 y.o. Devon Thompson presents to the ED for evaluation of suicide attempt.   Chart review indicates patient was just here 2 days ago, discharged yesterday back to police custody.  2 days ago when he presented, he attempted to hang himself by his bed sheets while in jail.  He was observed overnight and psychiatry saw him in the morning, clearing him and he was sent back to jail.  Patient returns to the ED today in police custody from the same jail where he tried to hang himself again with his bed sheets.  He reports frustration with the situation that he is locked up during Christmas and has 6 kids at home that he cannot see on a holiday.  Reports continued desire to harm himself due to this situation.  Denies any homicidality or hallucinations.  Denies drug ingestion, syncopal episodes, trauma or other events beyond trying to hang himself since he was just here.  History reviewed. No pertinent past medical history.  Patient Active Problem List   Diagnosis Date Noted   Stress reaction causing mixed disturbance of emotion and conduct 08/15/2021    History reviewed. No pertinent surgical history.  Prior to Admission medications   Medication Sig Start Date End Date Taking? Authorizing Provider  FLUoxetine (PROZAC) 20 MG capsule Take 1 capsule (20 mg total) by mouth daily. 11/15/21   Charm Rings, NP  risperiDONE (RISPERDAL) 0.5 MG tablet Take 1 tablet (0.5 mg total) by mouth 2 (two) times daily. 11/15/21   Charm Rings, NP    Allergies Amoxicillin and Penicillins  History reviewed. No pertinent family history.  Social History Social History    Tobacco Use   Smoking status: Every Day   Smokeless tobacco: Never  Substance Use Topics   Alcohol use: Yes   Drug use: Yes    Review of Systems  Constitutional: No fever/chills Eyes: No visual changes. ENT: No sore throat. Cardiovascular: Denies chest pain. Respiratory: Denies shortness of breath. Gastrointestinal: No abdominal pain.  No nausea, no vomiting.  No diarrhea.  No constipation. Genitourinary: Negative for dysuria. Musculoskeletal: Negative for back pain. Skin: Negative for rash. Neurological: Negative for headaches, focal weakness or numbness.  ____________________________________________   PHYSICAL EXAM:  VITAL SIGNS: Vitals:   11/16/21 2032  BP: 127/84  Pulse: 98  Resp: 16  Temp: 98 F (36.7 C)  SpO2: 100%    Constitutional: Alert and oriented. Well appearing and in no acute distress. Eyes: Conjunctivae are normal. PERRL. EOMI. Head: Atraumatic. Nose: No congestion/rhinnorhea. Mouth/Throat: Mucous membranes are moist.  Oropharynx non-erythematous. Neck: No stridor. No cervical spine tenderness to palpation. No ligature marks or signs of trauma to the neck. Cardiovascular: Normal rate, regular rhythm. Good peripheral circulation. Respiratory: Normal respiratory effort.  No retractions. Lungs CTAB. Gastrointestinal: Soft , nondistended, nontender to palpation.  Musculoskeletal: No joint effusions. No signs of acute trauma. Is medial right clavicle has a small closed deformity and is somewhat tender to palpation. Neurologic:  Normal speech and language. No gross focal neurologic deficits are appreciated. No gait instability noted. Cranial nerves II through XII intact 5/5 strength and sensation in all 4 extremities Skin:  Skin is warm, dry and intact.  No rash noted. Psychiatric: Mood and affect are normal. Speech and behavior are normal. ____________________________________________   LABS (all labs ordered are listed, but only abnormal results  are displayed)  Labs Reviewed  RESP PANEL BY RT-PCR (FLU A&B, COVID) ARPGX2 - Abnormal; Notable for the following components:      Result Value   SARS Coronavirus 2 by RT PCR POSITIVE (*)    All other components within normal limits  COMPREHENSIVE METABOLIC PANEL - Abnormal; Notable for the following components:   Glucose, Bld 115 (*)    All other components within normal limits  CBC WITH DIFFERENTIAL/PLATELET - Abnormal; Notable for the following components:   WBC 11.5 (*)    Monocytes Absolute 1.5 (*)    All other components within normal limits  ACETAMINOPHEN LEVEL - Abnormal; Notable for the following components:   Acetaminophen (Tylenol), Serum <10 (*)    All other components within normal limits  SALICYLATE LEVEL - Abnormal; Notable for the following components:   Salicylate Lvl <7.0 (*)    All other components within normal limits  ETHANOL  URINE DRUG SCREEN, QUALITATIVE ([facility] ONLY)   ____________________________________________  12 Lead EKG   ____________________________________________  RADIOLOGY  ED MD interpretation: 2 view CXR reviewed by me with old well-healed right clavicle fracture.  Otherwise without evidence of acute cardiopulmonary pathology.  Official radiology report(s): DG Chest 2 View  Result Date: 11/16/2021 CLINICAL DATA:  Right medial clavicle distortion. EXAM: CHEST - 2 VIEW COMPARISON:  Chest radiograph dated 12/12/2020. FINDINGS: No focal consolidation, pleural effusion, or pneumothorax. The cardiac silhouette is within limits. No acute osseous pathology. Old healed fracture deformity of the right clavicle. IMPRESSION: 1. No active cardiopulmonary disease. 2. Old healed fracture of the right clavicle. Electronically Signed   By: Elgie Collard M.D.   On: 11/16/2021 21:20    ____________________________________________   PROCEDURES and INTERVENTIONS  Procedure(s) performed (including Critical Care):  Procedures  Medications - No data to  display  ____________________________________________   MDM / ED COURSE   30 year old male presents from local jail in police custody after trying to hang himself again.  No evidence of medical pathology to preclude psychiatric evaluation and disposition.  No ligature marks or signs of significant strangulation injury.  I see no indication for a CTA neck.  No neurologic or vascular deficits.  No vision changes.  We will hold him here until psych can see him.  While I would often IVC the patient in the situation, he is in police custody and cannot go anywhere anyways.  Hold voluntarily for now as he is agreeable.   Clinical Course as of 11/16/21 2207  Wynelle Link Nov 16, 2021  2058 The patient has been placed in psychiatric observation due to the need to provide a safe environment for the patient while obtaining psychiatric consultation and evaluation, as well as ongoing medical and medication management to treat the patient's condition.  The patient has not been placed under full IVC at this time.   [DS]    Clinical Course User Index [DS] Delton Prairie, MD    ____________________________________________   FINAL CLINICAL IMPRESSION(S) / ED DIAGNOSES  Final diagnoses:  Intentional self-harm by hanging [facility])     ED Discharge Orders     None        Egbert Seidel   Note:  This document was prepared using Dragon voice recognition software and may include unintentional dictation errors.    Delton Prairie, MD 11/16/21 2209
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
## 2021-11-17 NOTE — ED Notes (Signed)
E-signature not working at this time. Pt verbalized understanding of D/C instructions, prescriptions and follow up care with no further questions at this time. Pt in NAD and ambulatory at time of D/C. Pt in [facility] custody at time of departure

## 2021-11-17 NOTE — Discharge Instructions (Signed)
Follow up with mental health in jail Psychiatric assessment for [facility] prison/psych ward for depression Suicide watch in jail with turtle suit and no blankets, sheets, or other things that can be used for self-harm Continue Prozac and Risperdal as prescribed

## 2021-11-17 NOTE — Consult Note (Signed)
[facility] Face-to-Face Psychiatry Consult   Reason for Consult:  suicide attempt Referring Physician:  EDP Patient Identification: Devon Thompson MRN:  [id number] Principal Diagnosis: Stress reaction causing mixed disturbance of emotion and conduct Diagnosis:  Stress reaction causing mixed disturbance of emotion and conduct   Total Time spent with patient: 1 hour  Subjective:   Devon Thompson is a 30 y.o. male patient admitted with suicide attempt.  Subjective:   Devon Thompson is a 30 y.o. male patient admitted with attempted hanging in jail, history of substance abuse.  Client presents from the jail after trying to hang himself with his clothes.   He was seen on 12/24 with directions to be placed in a turtle suit with no ligatures.  Not sure why he had clothes.  He is depressed related to being in jail with past notes of concern for his six children.  However, when asked about the effects of his actions on his children, he does not acknowledge his children or any concerns.  His concerns are a couple of people being "after me" in jail related to drug issues and not feeling safe despite being in a locked cell.  He reports they can still get to him.  Client would like to go to [facility], discussed accountability and how his mood is related to his current stressors.  It appears his behaviors are manipulative in nature as in September, he threatened suicide to get bond which he as granted after his threat.  Now, he is attempting to get what he wants with threats of self-harm, removal from his jail situation.  Explained to him that this should be handled by the medical team from jail and prison.  This case was reviewed and discussed with Dr Danella Sensing, psychiatrist, and she recommends he return to jail and be placed on suicide watch with the precautions of a turtle suit.  Discharge instructions:  Follow up with mental health in jail 2.  Psychiatric assessment for [facility] prison/psych ward for depression 3.   Suicide watch in jail with turtle suit and no blankets, sheets, or other things that can be used for self-harm Continue Prozac and Risperdal as prescribed  Shelia is COVID positive today with complaints of fatigue.  Not currently threatening self harm or harm to others, no psychosis.   Caveat: This provider evaluated the client on 9/24 after he threatened suicide when he was denied bond.  On assessment, he denied everything and wanted to leave as he was then granted bond after threatening suicidal ideations.   HPI on 12/24:  30 yo male presents from jail after placing a blanket around his neck in an attempt to hang himself.  He has several felony drug charges along with selling drug charges.  Today on assessment, he endorses depression, started Prozac to assist along with Risperdal to assist with irritability and any possible hallucinations.  He reported on admission he was hearing voices, not responding on assessment.  Intermittent suicidal ideations at times, none currently.  Discussed with the officer suicide watch and avoidance of any blankets, sheets, or other ligatures along with a turtle suit.  They do have this ability in jail and will continue his medications and mental health care at their facility.  Medically cleared and psychiatrically stable to return to be monitored in jail on suicide watch with a turtle suit in place.   Past Psychiatric History: substance abuse  Risk to Self:  none currently Risk to Others:  none Prior Inpatient Therapy:  none Prior Outpatient Therapy:  none per chart review, client reports care in [place]  Past Medical History: History reviewed. No pertinent past medical history. History reviewed. No pertinent surgical history. Family History: History reviewed. No pertinent family history. Family Psychiatric  History: unknown Social History:  Social History   Substance and Sexual Activity  Alcohol Use Yes     Social History   Substance and Sexual  Activity  Drug Use Yes    Social History   Socioeconomic History   Marital status: Married    Spouse name: Not on file   Number of children: Not on file   Years of education: Not on file   Highest education level: Not on file  Occupational History   Not on file  Tobacco Use   Smoking status: Every Day   Smokeless tobacco: Never  Substance and Sexual Activity   Alcohol use: Yes   Drug use: Yes   Sexual activity: Not on file  Other Topics Concern   Not on file  Social History Narrative   Not on file   Social Determinants of Health   Financial Resource Strain: Not on file  Food Insecurity: Not on file  Transportation Needs: Not on file  Physical Activity: Not on file  Stress: Not on file  Social Connections: Not on file   Additional Social History:    Allergies:   Allergies  Allergen Reactions   Amoxicillin Swelling   Penicillins Swelling    Labs:  Results for orders placed or performed during the hospital encounter of 11/16/21 (from the past 48 hour(s))  Resp Panel by RT-PCR (Flu A&B, Covid) Nasopharyngeal Swab     Status: Abnormal   Collection Time: 11/16/21  8:38 PM   Specimen: Nasopharyngeal Swab; Nasopharyngeal(NP) swabs in vial transport medium  Result Value Ref Range   SARS Coronavirus 2 by RT PCR POSITIVE (A) NEGATIVE    Comment: (NOTE) SARS-CoV-2 target nucleic acids are DETECTED.  The SARS-CoV-2 RNA is generally detectable in upper respiratory specimens during the acute phase of infection. Positive results are indicative of the presence of the identified virus, but do not rule out bacterial infection or co-infection with other pathogens not detected by the test. Clinical correlation with patient history and other diagnostic information is necessary to determine patient infection status. The expected result is Negative.  Fact Sheet for Patients: BloggerCourse.com  Fact Sheet for Healthcare  Providers: SeriousBroker.it  This test is not yet approved or cleared by the [place] FDA and  has been authorized for detection and/or diagnosis of SARS-CoV-2 by FDA under an Emergency Use Authorization (EUA).  This EUA will remain in effect (meaning this test can be used) for the duration of  the COVID-19 declaration under Section 564(b)(1) of the A ct, 21 U.S.C. section 360bbb-3(b)(1), unless the authorization is terminated or revoked sooner.     Influenza A by PCR NEGATIVE NEGATIVE   Influenza B by PCR NEGATIVE NEGATIVE    Comment: (NOTE) The Xpert Xpress SARS-CoV-2/FLU/RSV plus assay is intended as an aid in the diagnosis of influenza from Nasopharyngeal swab specimens and should not be used as a sole basis for treatment. Nasal washings and aspirates are unacceptable for Xpert Xpress SARS-CoV-2/FLU/RSV testing.  Fact Sheet for Patients: BloggerCourse.com  Fact Sheet for Healthcare Providers: SeriousBroker.it  This test is not yet approved or cleared by the [place] FDA and has been authorized for detection and/or diagnosis of SARS-CoV-2 by FDA under an Emergency Use Authorization (EUA). This EUA will  remain in effect (meaning this test can be used) for the duration of the COVID-19 declaration under Section 564(b)(1) of the Act, 21 U.S.C. section 360bbb-3(b)(1), unless the authorization is terminated or revoked.  Performed at [facility], [address] [place], [place] [address]   Comprehensive metabolic panel     Status: Abnormal   Collection Time: 11/16/21  8:38 PM  Result Value Ref Range   Sodium 135 135 - 145 mmol/L   Potassium 4.0 3.5 - 5.1 mmol/L   Chloride 100 98 - 111 mmol/L   CO2 28 22 - 32 mmol/L   Glucose, Bld 115 (H) 70 - 99 mg/dL    Comment: Glucose reference range applies only to samples taken after fasting for at least 8 hours.   BUN 13 6 - 20 mg/dL    Creatinine, Ser 0.98 0.61 - 1.24 mg/dL   Calcium 9.6 8.9 - 10.3 mg/dL   Total Protein 7.5 6.5 - 8.1 g/dL   Albumin 4.1 3.5 - 5.0 g/dL   AST 18 15 - 41 U/L   ALT 20 0 - 44 U/L   Alkaline Phosphatase 69 38 - 126 U/L   Total Bilirubin 0.7 0.3 - 1.2 mg/dL   GFR, Estimated >60 >60 mL/min    Comment: (NOTE) Calculated using the CKD-EPI Creatinine Equation (2021)    Anion gap 7 5 - 15    Comment: Performed at [facility], [address], [place], [place] [address]  Ethanol     Status: None   Collection Time: 11/16/21  8:38 PM  Result Value Ref Range   Alcohol, Ethyl (B) <10 <10 mg/dL    Comment: (NOTE) Lowest detectable limit for serum alcohol is 10 mg/dL.  For medical purposes only. Performed at [facility], [address] [place], [place] [address]   CBC with Diff     Status: Abnormal   Collection Time: 11/16/21  8:38 PM  Result Value Ref Range   WBC 11.5 (H) 4.0 - 10.5 K/uL   RBC 5.17 4.22 - 5.81 MIL/uL   Hemoglobin 15.6 13.0 - 17.0 g/dL   HCT 44.1 39.0 - 52.0 %   MCV 85.3 80.0 - 100.0 fL   MCH 30.2 26.0 - 34.0 pg   MCHC 35.4 30.0 - 36.0 g/dL   RDW 11.7 11.5 - 15.5 %   Platelets 308 150 - 400 K/uL   nRBC 0.0 0.0 - 0.2 %   Neutrophils Relative % 61 %   Neutro Abs 6.9 1.7 - 7.7 K/uL   Lymphocytes Relative 25 %   Lymphs Abs 2.9 0.7 - 4.0 K/uL   Monocytes Relative 13 %   Monocytes Absolute 1.5 (H) 0.1 - 1.0 K/uL   Eosinophils Relative 1 %   Eosinophils Absolute 0.1 0.0 - 0.5 K/uL   Basophils Relative 0 %   Basophils Absolute 0.1 0.0 - 0.1 K/uL   Immature Granulocytes 0 %   Abs Immature Granulocytes 0.03 0.00 - 0.07 K/uL    Comment: Performed at [facility], [address] [place], [place] [address]  Acetaminophen level     Status: Abnormal   Collection Time: 11/16/21  8:38 PM  Result Value Ref Range   Acetaminophen (Tylenol), Serum <10 (L) 10 - 30 ug/mL    Comment: (NOTE) Therapeutic concentrations vary significantly. A range of 10-30  ug/mL  may be an effective concentration for many patients. However, some  are best treated at concentrations outside of this range. Acetaminophen concentrations >150 ug/mL at  4 hours after ingestion  and >50 ug/mL at 12 hours after ingestion are often associated with  toxic reactions.  Performed at [facility], [address] [place], [place] [id number]   Salicylate level     Status: Abnormal   Collection Time: 11/16/21  8:38 PM  Result Value Ref Range   Salicylate Lvl [id number] (L) 7.0 - 30.0 mg/dL    Comment: Performed at [facility], [address] [place],  [address]    No current facility-administered medications for this encounter.   Current Outpatient Medications  Medication Sig Dispense Refill   FLUoxetine (PROZAC) 20 MG capsule Take 1 capsule (20 mg total) by mouth daily. 30 capsule 1   risperiDONE (RISPERDAL) 0.5 MG tablet Take 1 tablet (0.5 mg total) by mouth 2 (two) times daily. 60 tablet 1    Musculoskeletal: Strength & Muscle Tone: within normal limits Gait & Station: normal Patient leans: N/A  Psychiatric Specialty Exam: Physical Exam Vitals and nursing note reviewed.  Constitutional:      Appearance: Normal appearance.  HENT:     Head: Normocephalic.     Nose: Nose normal.  Pulmonary:     Effort: Pulmonary effort is normal.  Musculoskeletal:        General: Normal range of motion.     Cervical back: Normal range of motion.  Neurological:     General: No focal deficit present.     Mental Status: He is alert and oriented to person, place, and time.  Psychiatric:        Attention and Perception: Attention and perception normal.        Mood and Affect: Mood is anxious and depressed.        Speech: Speech normal.        Behavior: Behavior normal. Behavior is cooperative.        Thought Content: Thought content normal.        Cognition and Memory: Cognition and memory normal.        Judgment: Judgment is impulsive.    Review  of Systems  Constitutional:  Positive for fatigue.  Eyes: Negative.   Respiratory: Negative.    Cardiovascular: Negative.   Gastrointestinal: Negative.   Endocrine: Negative.   Genitourinary: Negative.   Musculoskeletal: Negative.   Allergic/Immunologic: Negative.   Neurological: Negative.   Hematological: Negative.   Psychiatric/Behavioral:  Positive for depression and dysphoric mood. The patient is nervous/anxious.    Blood pressure 127/84, pulse 98, temperature 98 F (36.7 C), temperature source Oral, resp. rate 16, height 5\' 6"  (1.676 m), weight 90.7 kg, SpO2 100 %.Body mass index is 32.28 kg/m.  General Appearance: Casual  Eye Contact:  Good  Speech:  Normal Rate  Volume:  Normal  Mood:  Anxious and Depressed  Affect:  Congruent  Thought Process:  Coherent and Descriptions of Associations: Intact  Orientation:  Full (Time, Place, and Person)  Thought Content:  WDL and Logical  Suicidal Thoughts:  No  Homicidal Thoughts:  No  Memory:  Immediate;   Good Recent;   Good Remote;   Good  Judgement:  Poor  Insight:  Fair  Psychomotor Activity:  Normal  Concentration:  Concentration: Good and Attention Span: Good  Recall:  Good  Fund of Knowledge:  Fair  Language:  Good  Akathisia:  No  Handed:  Right  AIMS (if indicated):     Assets:  Housing Leisure Time Physical Health Resilience Social Support  ADL's:  Intact  Cognition:  WNL  Sleep:        Physical Exam: Physical Exam Vitals and nursing note reviewed.  Constitutional:      Appearance: Normal appearance.  HENT:     Head: Normocephalic.     Nose: Nose normal.  Pulmonary:     Effort: Pulmonary effort is normal.  Musculoskeletal:        General: Normal range of motion.     Cervical back: Normal range of motion.  Neurological:     General: No focal deficit present.     Mental Status: He is alert and oriented to person, place, and time.  Psychiatric:        Attention and Perception: Attention and  perception normal.        Mood and Affect: Mood is anxious and depressed.        Speech: Speech normal.        Behavior: Behavior normal. Behavior is cooperative.        Thought Content: Thought content normal.        Cognition and Memory: Cognition and memory normal.        Judgment: Judgment is impulsive.   Review of Systems  Constitutional:  Positive for fatigue.  Eyes: Negative.   Respiratory: Negative.    Cardiovascular: Negative.   Gastrointestinal: Negative.   Genitourinary: Negative.   Musculoskeletal: Negative.   Neurological: Negative.   Psychiatric/Behavioral:  Positive for depression and dysphoric mood. The patient is nervous/anxious.   Blood pressure 127/84, pulse 98, temperature 98 F (36.7 C), temperature source Oral, resp. rate 16, height 5\' 6"  (1.676 m), weight 90.7 kg, SpO2 100 %. Body mass index is 32.28 kg/m.  Treatment Plan Summary: Stress reaction causing mixed disturbance of emotion and conduct: -Continue Prozac 20 mg daily -Continue Risperdal 0.5 mg BID for irritability  Follow up with mental health in jail 2.  Psychiatric assessment for [facility] prison/psych ward for depression 3.  Suicide watch in jail with turtle suit and no blankets, sheets, or other things that can be used for self-harm Continue Prozac and Risperdal as prescribed  Disposition: No evidence of imminent risk to self or others at present.   Supportive therapy provided about ongoing stressors.  Waylan Boga, NP 11/17/2021 8:43 AM

## 2021-11-17 NOTE — ED Provider Notes (Signed)
Emergency Medicine Observation Re-evaluation Note  Devon Thompson is a 30 y.o. male, seen on rounds today.  Pt initially presented to the ED for complaints of Suicidal Currently, the patient is resting.  Physical Exam  BP 127/84 (BP Location: Left Arm)    Pulse 98    Temp 98 F (36.7 C) (Oral)    Resp 16    Ht 1.676 m (5\' 6" )    Wt 90.7 kg    SpO2 100%    BMI 32.28 kg/m  Physical Exam Gen:  No acute distress Resp:  Breathing easily and comfortably, no accessory muscle usage Neuro:  Moving all four extremities, no gross focal neuro deficits Psych:  Resting currently, calm when awake  ED Course / MDM  EKG:   I have reviewed the labs performed to date as well as medications administered while in observation.  Recent changes in the last 24 hours include initial assessment.  Plan  Current plan is for psychiatry consultation.  Devon Thompson is not under involuntary commitment.     Joylene John, MD 11/17/21 [phone]

## 2021-11-17 NOTE — ED Notes (Signed)
In Police custody/ Pending consult

## 2021-11-18 ENCOUNTER — Telehealth: Payer: Self-pay

## 2021-11-18 NOTE — Telephone Encounter (Signed)
Transition Care Management Unsuccessful Follow-up Telephone Call  Date of discharge and from where:  11/17/2021-[facility]  Attempts:  1st Attempt  Reason for unsuccessful TCM follow-up call:  Unable to reach patient

## 2021-11-19 NOTE — Telephone Encounter (Signed)
Transition Care Management Unsuccessful Follow-up Telephone Call  Date of discharge and from where:  11/17/2021-[facility]  Attempts:  2nd Attempt  Reason for unsuccessful TCM follow-up call:  Unable to reach patient

## 2021-11-20 NOTE — Telephone Encounter (Signed)
Transition Care Management Unsuccessful Follow-up Telephone Call  Date of discharge and from where:  11/17/2021 from [facility]  Attempts:  3rd Attempt  Reason for unsuccessful TCM follow-up call:  Unable to reach patient
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
## 2022-07-22 ENCOUNTER — Emergency Department
Admission: EM | Admit: 2022-07-22 | Discharge: 2022-07-22 | Disposition: A | Discharge status: Home / Self Care | Payer: Medicaid Other | Attending: Emergency Medicine | Admitting: Emergency Medicine

## 2022-07-22 ENCOUNTER — Encounter: Payer: Self-pay | Admitting: Emergency Medicine

## 2022-07-22 DIAGNOSIS — T402X1A Poisoning by other opioids, accidental (unintentional), initial encounter: Secondary | ICD-10-CM | POA: Insufficient documentation

## 2022-07-22 DIAGNOSIS — R000 Tachycardia, unspecified: Secondary | ICD-10-CM | POA: Insufficient documentation

## 2022-07-22 DIAGNOSIS — T40601A Poisoning by unspecified narcotics, accidental (unintentional), initial encounter: Secondary | ICD-10-CM

## 2022-07-22 MED ORDER — NALOXONE HCL 4 MG/0.1ML NA LIQD: NASAL | 1 refills | Status: DC

## 2022-07-22 MED ORDER — ONDANSETRON 4 MG PO TBDP
4.0000 mg | ORAL_TABLET | Freq: Once | ORAL | Status: AC
  Administered 2022-07-22: 4 mg via ORAL
  Filled 2022-07-22: qty 1

## 2022-07-22 NOTE — ED Notes (Signed)
This RN attempted to contact emergency contact without success.

## 2022-07-22 NOTE — ED Notes (Signed)
Spoke to [employer] (pt's wife) about picking the patient up. States that she does not drive. Received verbal consent to speak to wife about pt's reason and condition for being here.
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
## 2022-08-13 ENCOUNTER — Telehealth: Payer: Self-pay

## 2022-08-13 NOTE — Telephone Encounter (Signed)
Patient called to connect with a Primary Care Provider. Unable to leave VM, VM not set up/VM full. Patient has Managed Medicaid. If patient returns call, please reach out to [employer], Therapist, sports.  Phone number in chart is wrong.

## 2022-08-20 ENCOUNTER — Telehealth: Payer: Self-pay

## 2022-08-20 NOTE — Telephone Encounter (Signed)
This encounter was created in error - please disregard.

## 2022-08-20 NOTE — Telephone Encounter (Signed)
Patient called to connect with a Primary Care Provider. Unable to leave VM, VM not set up/VM full. Patient has Managed Medicaid. If patient returns call, please reach out to Lattie Haw, Therapist, sports.

## 2022-08-26 ENCOUNTER — Telehealth: Payer: Self-pay

## 2022-08-26 NOTE — Telephone Encounter (Signed)
Patient called to connect with a Primary Care Provider. Unable to leave VM, VM not set up/VM full. Called number listed and was advised by caller I dialed wrong number. Verified that number was entered correctly.  Patient has Managed Medicaid. If patient returns call, please reach out to [employer], Therapist, sports.

## 2022-09-24 ENCOUNTER — Emergency Department
Admission: EM | Admit: 2022-09-24 | Discharge: 2022-09-24 | Disposition: A | Discharge status: Home / Self Care | Payer: Medicaid Other | Attending: Emergency Medicine | Admitting: Emergency Medicine

## 2022-09-24 ENCOUNTER — Other Ambulatory Visit: Payer: Self-pay

## 2022-09-24 ENCOUNTER — Emergency Department: Payer: Medicaid Other

## 2022-09-24 ENCOUNTER — Encounter: Payer: Self-pay | Admitting: *Deleted

## 2022-09-24 DIAGNOSIS — J029 Acute pharyngitis, unspecified: Secondary | ICD-10-CM | POA: Insufficient documentation

## 2022-09-24 LAB — CBC WITH DIFFERENTIAL/PLATELET
Abs Immature Granulocytes: 0.06 10*3/uL (ref 0.00–0.07)
Basophils Absolute: 0.1 10*3/uL (ref 0.0–0.1)
Basophils Relative: 1 %
Eosinophils Absolute: 0.5 10*3/uL (ref 0.0–0.5)
Eosinophils Relative: 3 %
HCT: 46 % (ref 39.0–52.0)
Hemoglobin: 15.6 g/dL (ref 13.0–17.0)
Immature Granulocytes: 0 %
Lymphocytes Relative: 17 %
Lymphs Abs: 2.6 10*3/uL (ref 0.7–4.0)
MCH: 30.1 pg (ref 26.0–34.0)
MCHC: 33.9 g/dL (ref 30.0–36.0)
MCV: 88.6 fL (ref 80.0–100.0)
Monocytes Absolute: 2 10*3/uL — ABNORMAL HIGH (ref 0.1–1.0)
Monocytes Relative: 13 %
Neutro Abs: 10 10*3/uL — ABNORMAL HIGH (ref 1.7–7.7)
Neutrophils Relative %: 66 %
Platelets: 319 10*3/uL (ref 150–400)
RBC: 5.19 MIL/uL (ref 4.22–5.81)
RDW: 11.7 % (ref 11.5–15.5)
WBC: 15.3 10*3/uL — ABNORMAL HIGH (ref 4.0–10.5)
nRBC: 0 % (ref 0.0–0.2)

## 2022-09-24 LAB — BASIC METABOLIC PANEL
Anion gap: 6 (ref 5–15)
BUN: 7 mg/dL (ref 6–20)
CO2: 27 mmol/L (ref 22–32)
Calcium: 9 mg/dL (ref 8.9–10.3)
Chloride: 105 mmol/L (ref 98–111)
Creatinine, Ser: 0.92 mg/dL (ref 0.61–1.24)
GFR, Estimated: >60 mL/min (ref >60)
Glucose, Bld: 106 mg/dL — ABNORMAL HIGH (ref 70–99)
Potassium: 3.5 mmol/L (ref 3.5–5.1)
Sodium: 138 mmol/L (ref 135–145)

## 2022-09-24 LAB — GROUP A STREP BY PCR: Group A Strep by PCR: NOT DETECTED

## 2022-09-24 MED ORDER — IOHEXOL 300 MG/ML  SOLN
80.0000 mL | Freq: Once | INTRAMUSCULAR | Status: AC | PRN
  Administered 2022-09-24: 80 mL via INTRAVENOUS

## 2022-09-24 MED ORDER — DEXAMETHASONE SODIUM PHOSPHATE 10 MG/ML IJ SOLN
10.0000 mg | Freq: Once | INTRAMUSCULAR | Status: AC
  Administered 2022-09-24: 10 mg via INTRAVENOUS
  Filled 2022-09-24: qty 1

## 2022-09-24 MED ORDER — SODIUM CHLORIDE 0.9 % IV BOLUS
1000.0000 mL | Freq: Once | INTRAVENOUS | Status: AC
  Administered 2022-09-24: 1000 mL via INTRAVENOUS

## 2022-09-24 NOTE — ED Notes (Signed)
Pt back up to nurses station with phone

## 2022-09-24 NOTE — ED Notes (Addendum)
Pt not in room for discharge instructions. Pt not in lobby or outside of entrance. No IV noted on bed, floor or trash can.   Pt contacted and told to come back to ED for IV removal. Pt verbalized understanding at this time.   # called  [phone]

## 2022-09-24 NOTE — ED Triage Notes (Signed)
[facility] of peritonsillar abscess. Pt has throat swelling and pain when swallowing   sx began yesterday.  Swelling to left side of face.  Pt alert.

## 2022-09-24 NOTE — ED Provider Notes (Signed)
[facility] Provider Note    Event Date/Time   First MD Initiated Contact with Patient 09/24/22 1618     (approximate)   History   Oral Swelling   HPI  Devon Thompson is a 31 y.o. male   Past medical history of substance use who presents to the emergency department with right-sided throat pain starting this morning.  Pain with swallowing.  No respiratory complaints.  Some subjective fevers, no chills.  No dental pain.  No cough or chest pain.  Some nasal congestion.  History was obtained via the patient.      Physical Exam   Triage Vital Signs: ED Triage Vitals  Enc Vitals Group     BP 09/24/22 1613 (!) 146/97     Pulse Rate 09/24/22 1613 (!) 115     Resp 09/24/22 1613 20     Temp 09/24/22 1613 98.8 F (37.1 C)     Temp Source 09/24/22 1613 Oral     SpO2 09/24/22 1613 97 %     Weight 09/24/22 1611 160 lb (72.6 kg)     Height 09/24/22 1611 5\' 4"  (1.626 m)     Head Circumference --      Peak Flow --      Pain Score 09/24/22 1611 10     Pain Loc --      Pain Edu? --      Excl. in [place]? --     Most recent vital signs: Vitals:   09/24/22 1613  BP: (!) 146/97  Pulse: (!) 115  Resp: 20  Temp: 98.8 F (37.1 C)  SpO2: 97%    General: Awake, no distress.  CV:  Good peripheral perfusion.  Resp:  Normal effort.  Abd:  No distention.  Other:  Uvula midline, unable to fully visualize tonsillar pillars, tonsils, some trismus, neck is supple with full range of motion tachycardic 115 and afebrile.  Phonation is normal.  Maintaining secretions no respiratory distress.   ED Results / Procedures / Treatments   Labs (all labs ordered are listed, but only abnormal results are displayed) Labs Reviewed  BASIC METABOLIC PANEL - Abnormal; Notable for the following components:      Result Value   Glucose, Bld 106 (*)    All other components within normal limits  CBC WITH DIFFERENTIAL/PLATELET - Abnormal; Notable for the following components:    WBC 15.3 (*)    Neutro Abs 10.0 (*)    Monocytes Absolute 2.0 (*)    All other components within normal limits  GROUP A STREP BY PCR     I reviewed labs and they are notable for WBC 15.3, group A strep is negative.    RADIOLOGY I independently reviewed and interpreted CT of the neck and see no obvious fluid collection   PROCEDURES:  Critical Care performed: No  Procedures   MEDICATIONS ORDERED IN ED: Medications  dexamethasone (DECADRON) injection 10 mg (10 mg Intravenous Given 09/24/22 1656)  sodium chloride 0.9 % bolus 1,000 mL (1,000 mLs Intravenous New Bag/Given 09/24/22 1656)  iohexol (OMNIPAQUE) 300 MG/ML solution 80 mL (80 mLs Intravenous Contrast Given 09/24/22 1751)     IMPRESSION / MDM / ASSESSMENT AND PLAN / ED COURSE  I reviewed the triage vital signs and the nursing notes.                              Differential diagnosis includes, but is not  limited to, peritonsillar abscess, strep pharyngitis, viral URI, viral pharyngitis, considered but less likely tracheitis or deep space neck infection.   MDM: Patient with some trismus and neck pain, pain with swallowing with above differential diagnosis.  Strep test in the ED.  CT of the soft tissue of the neck to assess for abscess.  Allergic to amoxicillin/penicillins, if indicated will give clindamycin for abscess.  Negative work-up as above.  Patient feels better with dexamethasone.  Dispo: After careful consideration of this patient's presentation, medical and social risk factors, and evaluation in the emergency department I engaged in shared decision making with the patient and/or their representative to consider admission or observation and this patient was ultimately discharged because sore throat much improved with steroids and CT scan negative for abscess, no signs of deep space neck infection, and group A strep test negative and patient stable and improved condition..   Patient's presentation is most  consistent with acute presentation with potential threat to life or bodily function.       FINAL CLINICAL IMPRESSION(S) / ED DIAGNOSES   Final diagnoses:  Sore throat     Rx / DC Orders   ED Discharge Orders     None        Note:  This document was prepared using Dragon voice recognition software and may include unintentional dictation errors.    Pilar Jarvis, MD 09/24/22 [phone]

## 2022-09-24 NOTE — ED Notes (Addendum)
Pt left before he received discharge paperwork or discharge vitals.

## 2022-09-24 NOTE — ED Notes (Addendum)
Pt asking for phone to call ride.

## 2022-09-24 NOTE — Discharge Instructions (Signed)
Take acetaminophen 650 mg and ibuprofen 400 mg every 6 hours for pain.  Take with food. Thank you for choosing us for your health care today!  Please see your primary doctor this week for a follow up appointment.   If you do not have a primary doctor call the following clinics to establish care:  If you have insurance:  [facility] [phone] [address] [place] [address]   [facility]  [phone] [address] [place] [address]   If you do not have insurance:  [facility]  [phone] [address]   [address]  Sometimes, in the early stages of certain disease courses it is difficult to detect in the emergency department evaluation -- so, it is important that you continue to monitor your symptoms and call your doctor right away or return to the emergency department if you develop any new or worsening symptoms.  It was my pleasure to care for you today.   Marinda Tyer S. Shaday Rayborn, MD  

## 2022-09-24 NOTE — ED Notes (Addendum)
Pt returned to stat desk at this time. Pt stated, "I was discharged and they called me to come back because I had the IV in my arm. I forgot all about it being there. I was going to take it out myself. " IV was removed by this tech.

## 2022-09-24 NOTE — ED Triage Notes (Signed)
First nurse Note:  C/O left throat swelling since this morning.  Patient sent from [facility] for ED evaluation to r/o peritonsillar abscess.  AAOx3.  Skin warm and dry.  Voice clear and strong.  Managing oral secretions. NAD

## 2023-05-13 ENCOUNTER — Encounter: Payer: Self-pay | Admitting: *Deleted

## 2023-05-13 ENCOUNTER — Other Ambulatory Visit: Payer: Self-pay

## 2023-05-13 DIAGNOSIS — L02414 Cutaneous abscess of left upper limb: Secondary | ICD-10-CM | POA: Diagnosis present

## 2023-05-13 DIAGNOSIS — Z5321 Procedure and treatment not carried out due to patient leaving prior to being seen by health care provider: Secondary | ICD-10-CM | POA: Insufficient documentation

## 2023-05-13 LAB — BASIC METABOLIC PANEL
Anion gap: 12 (ref 5–15)
BUN: 20 mg/dL (ref 6–20)
CO2: 21 mmol/L — ABNORMAL LOW (ref 22–32)
Calcium: 9.4 mg/dL (ref 8.9–10.3)
Chloride: 102 mmol/L (ref 98–111)
Creatinine, Ser: 1.25 mg/dL — ABNORMAL HIGH (ref 0.61–1.24)
GFR, Estimated: >60 mL/min (ref >60)
Glucose, Bld: 105 mg/dL — ABNORMAL HIGH (ref 70–99)
Potassium: 3.9 mmol/L (ref 3.5–5.1)
Sodium: 135 mmol/L (ref 135–145)

## 2023-05-13 LAB — CBC
HCT: 49.9 % (ref 39.0–52.0)
Hemoglobin: 16.4 g/dL (ref 13.0–17.0)
MCH: 30.2 pg (ref 26.0–34.0)
MCHC: 32.9 g/dL (ref 30.0–36.0)
MCV: 91.9 fL (ref 80.0–100.0)
Platelets: 289 10*3/uL (ref 150–400)
RBC: 5.43 MIL/uL (ref 4.22–5.81)
RDW: 11.2 % — ABNORMAL LOW (ref 11.5–15.5)
WBC: 13.4 10*3/uL — ABNORMAL HIGH (ref 4.0–10.5)
nRBC: 0 % (ref 0.0–0.2)
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
## 2023-05-14 ENCOUNTER — Emergency Department
Admission: EM | Admit: 2023-05-14 | Discharge: 2023-05-14 | Disposition: A | Discharge status: Against Medical Advice | Attending: Emergency Medicine | Admitting: Emergency Medicine

## 2023-08-12 ENCOUNTER — Ambulatory Visit (HOSPITAL_COMMUNITY)
Admission: EM | Admit: 2023-08-12 | Discharge: 2023-08-12 | Disposition: A | Discharge status: Home / Self Care | Payer: Self-pay | Attending: Behavioral Health | Admitting: Behavioral Health

## 2023-08-12 DIAGNOSIS — F1510 Other stimulant abuse, uncomplicated: Secondary | ICD-10-CM

## 2023-08-12 DIAGNOSIS — F1910 Other psychoactive substance abuse, uncomplicated: Secondary | ICD-10-CM | POA: Insufficient documentation

## 2023-08-12 NOTE — Discharge Instructions (Addendum)
Based on the information that you have provided and the presenting issues outpatient services and resources for have been recommended.  It is imperative that you follow through with treatment recommendations within 5-7 days from the of discharge to mitigate further risk to your safety and mental well-being. A list of referrals has been provided below to get you started.  You are not limited to the list provided.  In case of an urgent crisis, you may contact the Mobile Crisis Unit with [facility] at [phone].   [facility] [address],  [place], [place] [address] [phone]  [facility] [address],  [place], [place] [address] [phone]  [facility] [address] [phone] phone [phone] fax   [facility] [address] [place],  [place], [place] [address] [phone]  [facility] [address]  [place], [place] [address] [phone]   [facility] [address],  [place], [place] [address]  [phone]   [facility] [address] [place], [place] [address]  [phone]   Sofie Rower, [facility] [address]  [address] [place], [place] [address] [phone]    Lin Landsman Licensed Professional Counselor, [facility], [facility]  [facility] [address]  [place], [place] [address] [phone]  Dr. Monte Fantasia Pastoral Counselor/Therapist, PsyD, [place]iv, [place]  [facility], [facility] [address]  [place], [place] [address] [phone]  [facility] [address]  [place], [place] [address] [phone] (Main Office) [phone] (Shift [place]) [phone])  [place] [address],  [place], [place] [address] Phone: [phone]  [facility] at [facility],  [address],  [place], [place] [address] [phone]   [facility] [address]  [place], [place] [address] [phone]  [facility] M.J., [place] Counselor [address] [place] [address] [phone]   [facility] [address] [place], [place] [address] Dr[address] Charly[address] Court O'Neal [phone]  [facility] [address],  [place], [place] [address] [phone]   [facility] [address]  [place], [place] [address] [phone]  [facility] Address: [address] [place],                  [place], [place] [address] Phone:    [phone]   [facility] Address: [address] [place],                    [place], [place] [address] Phone: [phone]   [facility] [address], [place] [phone]  [facility] [address] Satira Sark  [place], [place] [address] [phone]   [facility] [place] [address] [place], [place] [address] Phone Number: [phone]  [facility] [address] [place], [place] [phone]  [facility] [address],  [place], [place] [address] [phone] [facility] [address]  [place], [place] [address] [phone]  [facility] [address] [phone] phone [phone] fax [phone]- [facility] Coordinator  [facility] [address] [place], [place] [address]  (  [phone]  [facility] [employer] [facility] [address] [place], [place] [address] [phone]  [facility] [address] [place], [place] [address] [phone]   [facility] [address]  [place], [place] [address] [phone]   _________________________________________       If you need help, please call our office: P [phone]. [phone]  [facility] [address] [place], [place] [address] Hours Monday and Wednesday: 9:00 am to 6:00 pm Tuesday and Thursday: 9:00 am to 7:00 pm Friday: 11:00 am to 5:00 pm [facility] walk-in hours: Tuesday, Wednesday, and Thursday from 9 am-12 pm  [facility] [address] [place], [place] [address] Hours Monday and Tuesday 10:00 am to 6:00 pm Walk-in hours: Monday and Tuesday from 10 am-12 pm

## 2023-08-12 NOTE — Progress Notes (Signed)
   08/12/23 0623  [facility] Triage Screening (Walk-ins at [facility] only)  How Did You Hear About [place]? Family/Friend  What Is the Reason for Your Visit/Call Today? Patient presents to the [facility] due to complications with his bipolar disorder.  Patient has not been sleeping, he has experienced a decreased appetite, his anxiety level is high and he states that he has been irritable.  Patient states that he is not suicidal, homicidal or psychotic.  Prior suicide attempt more than a year ago by hanging while incarcerated.  Patient states that he has been abusing [place], but states that he has not had any in the past two days.  No alcohol or other drug use.  Patient states that he is currently on probation. Patient states that he is depressed and feels like he needs to be on medication.  Patient states that he just got out of rehab from [facility] and states that he was clean for four months.  States that he has only relapsed twice.  Patient is routine.  How Long Has This Been Causing You Problems? 1 wk - 1 month  Have You Recently Had Any Thoughts About Hurting Yourself? No  How long ago did you have thoughts about hurting yourself? NA  Are You Planning to Commit Suicide/Harm Yourself At This time? No  Have you Recently Had Thoughts About Hurting Someone Karolee Ohs? No  Are You Planning To Harm Someone At This Time? No  Are you currently experiencing any auditory, visual or other hallucinations? No  Have You Used Any Alcohol or Drugs in the Past 24 Hours? No  Do you have any current medical co-morbidities that require immediate attention? No  Clinician description of patient physical appearance/behavior: cooperative, depressed mood  What Do You Feel Would Help You the Most Today? Treatment for Depression or other mood problem  If access to [facility] Urgent Care was not available, would you have sought care in the Emergency Department? No  Determination of Need Routine (7 days)  Options For Referral Medication Management

## 2023-08-12 NOTE — ED Provider Notes (Signed)
Behavioral Health Urgent Care Medical Screening Exam  Patient Name: Devon Thompson MRN: [id number] Date of Evaluation: 08/12/23 Chief Complaint:  "I can't sleep, arguing a lot more and decreased appetite." Diagnosis:  Final diagnoses:  Methamphetamine abuse (HCC)    History of Present illness: Devon Thompson is a 32 y.o. male patient who presents to the [facility] urgent care accompanied by his girlfriend Jacquelyne Balint with a c/o "I can't sleep, arguing a lot more and decreased appetite."  Patient states that he was diagnosed with bipolar at the age of 32 years old. He states that he's never been treated for bipolar as an adult. He complains of "I can't sleep, arguing a lot more and decreased appetite." He reports sleeping on average 3-4 hours per night. He reports irritability and states that he has been arguing more with his girlfriend. He denies manic symptoms. He denies depressive symptoms. He denies auditory or visual hallucinations. He denies paranoia. He reports using methamphetamines, IV use, on average a gram per day, every day for the past 2 years. He reports last using methamphetamines last night. He states that he relapsed on methamphetamines 3 months ago when he was released from [facility] rehab. He denies using other illicit drugs or drinking alcohol. He reports substance abuse treatment at [facility] for 2 to 3 months when he was released from jail around the beginning of the year. He denies upcoming court dates but states that he is on probation. He resides with his girlfriend. He reports having 6 children.  He is unemployed. He denies outpatient psychiatry or therapy. He denies a family psychiatric history.  The patient's girlfriend states that the patient has not been sleeping that well and has been delusional thinking that she is cheating on him. She states that the patient may have bipolar.   On evaluation, patient is alert and oriented x  4. His thought process is linear speech is clear and coherent. His mood is euthymic and affect is congruent. He denies SI/HI/AVH. There is no objective evidence that the patient is currently responding to internal or external stimuli, or experiencing paranoia or delusional thought content. He has fair eye contact. He is casually dressed. He is calm and cooperative and does not appear to be in acute distress  Plan of care: I discussed with the patient and his girlfriend that the patient's symptoms are consistent with his methamphetamine use. I discussed residential treatment options for substance abuse treatment. Patient states that he does not want to go inpatient. I discussed with the patient that he would need to refrain from using illicit drugs to reduce symptoms and discussed how stimulant use can cause decreased appetite, difficulty sleeping and psychosis. Patient recommended to follow up with outpatient psychiatry and outpatient substance abuse facilities for treatment. Safety planning completed. Patient safely discharged to the lobby.  Flowsheet Row ED from 08/12/2023 in [facility] ED from 05/14/2023 in [facility] Emergency Department at [facility] ED from 09/24/2022 in [facility] Emergency Department at [facility]  C-SSRS RISK CATEGORY Error: Q6 is Yes, you must answer 7 No Risk No Risk       Psychiatric Specialty Exam  Presentation  General Appearance:Appropriate for Environment  Eye Contact:Fair  Speech:Clear and Coherent  Speech Volume:Normal  Handedness:Right   Mood and Affect  Mood: Euthymic  Affect: Congruent   Thought Process  Thought Processes: Coherent; Goal Directed  Descriptions of Associations:Intact  Orientation:Full (Time, Place and Person)  Thought Content:Logical  Diagnosis of Schizophrenia or Schizoaffective disorder in past: No data recorded Duration of Psychotic Symptoms: No data recorded  Hallucinations:None  Ideas of Reference:None  Suicidal Thoughts:No  Homicidal Thoughts:No   Sensorium  Memory: Immediate Fair; Recent Fair; Remote Fair  Judgment: Fair  Insight: Fair   Art therapist  Concentration: Fair  Attention Span: Fair  Recall: [employer] of Knowledge: Fair  Language: Fair   Psychomotor Activity  Psychomotor Activity: Normal   Assets  Assets: Leisure Time; Manufacturing systems engineer; Desire for Improvement; Physical Health; Social Support   Sleep  Sleep: Poor  Number of hours:  3   Physical Exam: Physical Exam HENT:     Head: Normocephalic.  Eyes:     Conjunctiva/sclera: Conjunctivae normal.  Cardiovascular:     Rate and Rhythm: Tachycardia present.  Pulmonary:     Effort: Pulmonary effort is normal.  Musculoskeletal:        General: Normal range of motion.  Neurological:     Mental Status: He is alert and oriented to person, place, and time.   Review of Systems  Constitutional: Negative.   HENT: Negative.    Eyes: Negative.   Respiratory:  Negative for shortness of breath.   Cardiovascular:  Negative for chest pain and palpitations.  Gastrointestinal: Negative.   Genitourinary: Negative.   Musculoskeletal: Negative.   Neurological: Negative.   Endo/Heme/Allergies: Negative.   Psychiatric/Behavioral:  Positive for substance abuse.    Blood pressure (!) 148/95, pulse (!) 110, temperature 98.2 F (36.8 C), temperature source Oral, resp. rate 20, SpO2 100%. There is no height or weight on file to calculate BMI.  Musculoskeletal: Strength & Muscle Tone: within normal limits Gait & Station: normal Patient leans: N/A   [facility] MSE Discharge Disposition for Follow up and Recommendations: Based on my evaluation the patient does not appear to have an emergency medical condition and can be discharged with resources and follow up care in outpatient services for Medication Management, Individual Therapy, and Group  Therapy  Based on the information that you have provided and the presenting issues outpatient services and resources for have been recommended.  It is imperative that you follow through with treatment recommendations within 5-7 days from the of discharge to mitigate further risk to your safety and mental well-being. A list of referrals has been provided below to get you started.  You are not limited to the list provided.  In case of an urgent crisis, you may contact the Mobile Crisis Unit with [facility] at [phone].   [facility] [address],  [place], [place] [address] [phone]  [facility] [address]  [place], [place] [address] [phone]  [facility] [address] [phone] phone [phone] fax   [facility] [address] [place],  [place], [place] [address] [phone]  [facility] [address]  [place], [place] [address] [phone]   [facility] [address],  [place], [place] [address]  [phone]   [facility] [address] [place], [place] [address]  [phone]   Sofie Rower, [facility] [address] [place], [place] [address] [phone]    Lin Landsman Licensed Professional Counselor, [facility], [facility]  [facility] [address]  [place], [place] [address] [phone]  Dr. Monte Fantasia Pastoral Counselor/Therapist, PsyD, [place]iv, [place]  [facility] [address]  [place], [place] [address] [phone]  [facility] [address]  [place], [place] [address] [phone] (Main Office) [phone] (Shift Lead) [phone])  [facility] [address],  [place], [place] [address] Phone: [phone]  [facility],  [address],  [place], [place] [address] [phone]   [facility] [address]  [place], [place] [address] [phone]  [facility] M.J., Greif Counselor [address] [place] [address] [phone]   [facility] [address] [place], [place] [address] Dr. Charlynn Court O'Neal [phone]  [facility] [address],  [place], [place] [address] [phone]   [facility] [address],  [place], [place] [address] [phone]  [facility] Address: [address] [place], [place] [address] Phone:    [phone]   [facility] Address: [address] [place],                    [place], [place] [address] Phone: [phone]   [facility] [address] [place] [phone]  [facility] [address] [place]  [place], [place] [address] [phone]   [facility] [address] [place], [place] [address] Phone Number: [phone]  [facility] [address] [place], [place] [phone]  [facility] [address],  [place], [place] [address] [phone]  [facility] [address],  [place], [place] [address] [phone]  [facility] [address] [phone] phone [phone] fax [phone]- [facility] Coordinator  [facility] [address] [place], [place] [address]  [phone]  [facility] [address] [place], [place] [address] [phone]  [facility] [address] [place], [place] [address] [phone]   [facility] [address]  [place], [place] [address] [phone]   _________________________________________       If you need help, please call our office: P [phone] ext. [phone] F [phone]  [facility] [address] [place], [place] [address] Hours Monday and Wednesday: 9:00 am to 6:00 pm Tuesday and Thursday: 9:00 am to 7:00 pm Friday: 11:00 am to 5:00 pm [facility] walk-in hours: Tuesday, Wednesday, and Thursday from 9 am-12 pm  [facility] [address] [place], [place] [address] Hours Monday and Tuesday 10:00 am to 6:00 pm Walk-in hours: Monday and Tuesday from 10 am-12 pm   Layla Barter, NP 08/12/2023, 9:03 AM

## 2023-09-05 ENCOUNTER — Other Ambulatory Visit: Payer: Self-pay

## 2023-09-05 DIAGNOSIS — J020 Streptococcal pharyngitis: Secondary | ICD-10-CM | POA: Insufficient documentation

## 2023-09-05 DIAGNOSIS — Z5321 Procedure and treatment not carried out due to patient leaving prior to being seen by health care provider: Secondary | ICD-10-CM | POA: Insufficient documentation

## 2023-09-05 LAB — GROUP A STREP BY PCR: Group A Strep by PCR: DETECTED — AB

## 2023-09-05 NOTE — ED Triage Notes (Signed)
Pt sts that he has been having a sore throat for the last two days. Pt sts that he has not taken anything for the pain. Pt is taking pictures of himself and ID band while sitting in triage at this time.

## 2023-09-06 ENCOUNTER — Emergency Department
Admission: EM | Admit: 2023-09-06 | Discharge: 2023-09-06 | Disposition: A | Discharge status: Against Medical Advice | Attending: Emergency Medicine | Admitting: Emergency Medicine

## 2023-09-20 ENCOUNTER — Emergency Department: Payer: Self-pay

## 2023-09-20 ENCOUNTER — Emergency Department
Admission: EM | Admit: 2023-09-20 | Discharge: 2023-09-20 | Disposition: A | Discharge status: Home / Self Care | Payer: Self-pay | Attending: Emergency Medicine | Admitting: Emergency Medicine

## 2023-09-20 DIAGNOSIS — R0789 Other chest pain: Secondary | ICD-10-CM | POA: Insufficient documentation

## 2023-09-20 DIAGNOSIS — F22 Delusional disorders: Secondary | ICD-10-CM | POA: Insufficient documentation

## 2023-09-20 LAB — CBC WITH DIFFERENTIAL/PLATELET
Abs Immature Granulocytes: 0.04 10*3/uL (ref 0.00–0.07)
Basophils Absolute: 0.1 10*3/uL (ref 0.0–0.1)
Basophils Relative: 1 %
Eosinophils Absolute: 0.3 10*3/uL (ref 0.0–0.5)
Eosinophils Relative: 3 %
HCT: 45.1 % (ref 39.0–52.0)
Hemoglobin: 15.2 g/dL (ref 13.0–17.0)
Immature Granulocytes: 0 %
Lymphocytes Relative: 22 %
Lymphs Abs: 2.5 10*3/uL (ref 0.7–4.0)
MCH: 30.1 pg (ref 26.0–34.0)
MCHC: 33.7 g/dL (ref 30.0–36.0)
MCV: 89.3 fL (ref 80.0–100.0)
Monocytes Absolute: 1.3 10*3/uL — ABNORMAL HIGH (ref 0.1–1.0)
Monocytes Relative: 12 %
Neutro Abs: 7 10*3/uL (ref 1.7–7.7)
Neutrophils Relative %: 62 %
Platelets: 324 10*3/uL (ref 150–400)
RBC: 5.05 MIL/uL (ref 4.22–5.81)
RDW: 12.4 % (ref 11.5–15.5)
WBC: 11.1 10*3/uL — ABNORMAL HIGH (ref 4.0–10.5)
nRBC: 0 % (ref 0.0–0.2)

## 2023-09-20 LAB — COMPREHENSIVE METABOLIC PANEL
ALT: 23 U/L (ref 0–44)
AST: 21 U/L (ref 15–41)
Albumin: 4.2 g/dL (ref 3.5–5.0)
Alkaline Phosphatase: 65 U/L (ref 38–126)
Anion gap: 7 (ref 5–15)
BUN: 9 mg/dL (ref 6–20)
CO2: 25 mmol/L (ref 22–32)
Calcium: 8.8 mg/dL — ABNORMAL LOW (ref 8.9–10.3)
Chloride: 104 mmol/L (ref 98–111)
Creatinine, Ser: 0.95 mg/dL (ref 0.61–1.24)
GFR, Estimated: >60 mL/min (ref >60)
Glucose, Bld: 100 mg/dL — ABNORMAL HIGH (ref 70–99)
Potassium: 3.7 mmol/L (ref 3.5–5.1)
Sodium: 136 mmol/L (ref 135–145)
Total Bilirubin: 0.7 mg/dL (ref 0.3–1.2)
Total Protein: 7.5 g/dL (ref 6.5–8.1)

## 2023-09-20 LAB — MAGNESIUM: Magnesium: 2.4 mg/dL (ref 1.7–2.4)

## 2023-09-20 LAB — TROPONIN I (HIGH SENSITIVITY)
Troponin I (High Sensitivity): 4 ng/L (ref <18)
Troponin I (High Sensitivity): 4 ng/L (ref <18)

## 2023-09-20 MED ORDER — LORAZEPAM 2 MG/ML IJ SOLN
1.0000 mg | Freq: Once | INTRAMUSCULAR | Status: AC
  Administered 2023-09-20: 1 mg via INTRAVENOUS
  Filled 2023-09-20: qty 1

## 2023-09-20 NOTE — ED Notes (Signed)
This RN notified by [employer] per [place] [employer], they will speak with patient upon arrival back to his home and advised patient to call [place] [employer] upon arrival back to his home. Will notify patient.

## 2023-09-20 NOTE — ED Notes (Signed)
Pt resting, rise and fall of chest noted. Bed is in lowest, locked position, with call bell in reach. Pt is in NAD at this time.

## 2023-09-20 NOTE — Discharge Instructions (Addendum)
Once you get back to your house, please call [facility] [employer] and they will come by and talk with you.  They should be expecting her call
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
## 2023-10-06 ENCOUNTER — Emergency Department
Admission: EM | Admit: 2023-10-06 | Discharge: 2023-10-06 | Discharge status: Against Medical Advice | Payer: Self-pay | Attending: Emergency Medicine | Admitting: Emergency Medicine

## 2023-10-06 ENCOUNTER — Other Ambulatory Visit: Payer: Self-pay

## 2023-10-06 ENCOUNTER — Emergency Department: Payer: Self-pay

## 2023-10-06 DIAGNOSIS — R0789 Other chest pain: Secondary | ICD-10-CM | POA: Insufficient documentation

## 2023-10-06 DIAGNOSIS — Z5321 Procedure and treatment not carried out due to patient leaving prior to being seen by health care provider: Secondary | ICD-10-CM | POA: Insufficient documentation

## 2023-10-06 LAB — CBC
HCT: 47 % (ref 39.0–52.0)
Hemoglobin: 15.6 g/dL (ref 13.0–17.0)
MCH: 30.5 pg (ref 26.0–34.0)
MCHC: 33.2 g/dL (ref 30.0–36.0)
MCV: 91.8 fL (ref 80.0–100.0)
Platelets: 325 10*3/uL (ref 150–400)
RBC: 5.12 MIL/uL (ref 4.22–5.81)
RDW: 12.1 % (ref 11.5–15.5)
WBC: 13.9 10*3/uL — ABNORMAL HIGH (ref 4.0–10.5)
nRBC: 0 % (ref 0.0–0.2)

## 2023-10-06 LAB — BASIC METABOLIC PANEL
Anion gap: 6 (ref 5–15)
BUN: 14 mg/dL (ref 6–20)
CO2: 27 mmol/L (ref 22–32)
Calcium: 8.7 mg/dL — ABNORMAL LOW (ref 8.9–10.3)
Chloride: 104 mmol/L (ref 98–111)
Creatinine, Ser: 1.09 mg/dL (ref 0.61–1.24)
GFR, Estimated: >60 mL/min (ref >60)
Glucose, Bld: 54 mg/dL — ABNORMAL LOW (ref 70–99)
Potassium: 3.8 mmol/L (ref 3.5–5.1)
Sodium: 137 mmol/L (ref 135–145)

## 2023-10-06 LAB — TROPONIN I (HIGH SENSITIVITY): Troponin I (High Sensitivity): 6 ng/L (ref <18)
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
## 2023-10-07 ENCOUNTER — Other Ambulatory Visit: Payer: Self-pay

## 2023-10-07 ENCOUNTER — Emergency Department
Admission: EM | Admit: 2023-10-07 | Discharge: 2023-10-07 | Disposition: A | Discharge status: Home / Self Care | Payer: Self-pay | Attending: Emergency Medicine | Admitting: Emergency Medicine

## 2023-10-07 DIAGNOSIS — F430 Acute stress reaction: Secondary | ICD-10-CM | POA: Diagnosis present

## 2023-10-07 DIAGNOSIS — F22 Delusional disorders: Secondary | ICD-10-CM | POA: Insufficient documentation

## 2023-10-07 DIAGNOSIS — F1510 Other stimulant abuse, uncomplicated: Secondary | ICD-10-CM | POA: Insufficient documentation

## 2023-10-07 DIAGNOSIS — F4325 Adjustment disorder with mixed disturbance of emotions and conduct: Secondary | ICD-10-CM | POA: Insufficient documentation

## 2023-10-07 DIAGNOSIS — R443 Hallucinations, unspecified: Secondary | ICD-10-CM

## 2023-10-07 LAB — COMPREHENSIVE METABOLIC PANEL
ALT: 25 U/L (ref 0–44)
AST: 20 U/L (ref 15–41)
Albumin: 4.3 g/dL (ref 3.5–5.0)
Alkaline Phosphatase: 71 U/L (ref 38–126)
Anion gap: 7 (ref 5–15)
BUN: 13 mg/dL (ref 6–20)
CO2: 27 mmol/L (ref 22–32)
Calcium: 8.8 mg/dL — ABNORMAL LOW (ref 8.9–10.3)
Chloride: 102 mmol/L (ref 98–111)
Creatinine, Ser: 0.98 mg/dL (ref 0.61–1.24)
GFR, Estimated: >60 mL/min (ref >60)
Glucose, Bld: 98 mg/dL (ref 70–99)
Potassium: 4.3 mmol/L (ref 3.5–5.1)
Sodium: 136 mmol/L (ref 135–145)
Total Bilirubin: 0.5 mg/dL (ref <1.2)
Total Protein: 7.8 g/dL (ref 6.5–8.1)

## 2023-10-07 LAB — CBC
HCT: 47.6 % (ref 39.0–52.0)
Hemoglobin: 16 g/dL (ref 13.0–17.0)
MCH: 30.9 pg (ref 26.0–34.0)
MCHC: 33.6 g/dL (ref 30.0–36.0)
MCV: 91.9 fL (ref 80.0–100.0)
Platelets: 337 10*3/uL (ref 150–400)
RBC: 5.18 MIL/uL (ref 4.22–5.81)
RDW: 12.2 % (ref 11.5–15.5)
WBC: 11.8 10*3/uL — ABNORMAL HIGH (ref 4.0–10.5)
nRBC: 0 % (ref 0.0–0.2)

## 2023-10-07 LAB — ETHANOL: Alcohol, Ethyl (B): <10 mg/dL (ref <10)

## 2023-10-07 LAB — ACETAMINOPHEN LEVEL: Acetaminophen (Tylenol), Serum: <10 ug/mL — ABNORMAL LOW (ref 10–30)

## 2023-10-07 LAB — SALICYLATE LEVEL: Salicylate Lvl: <7 mg/dL — ABNORMAL LOW (ref 7.0–30.0)
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
## 2023-10-07 NOTE — ED Notes (Signed)
Pt is being discharged per RN Connye Burkitt.

## 2023-10-07 NOTE — ED Notes (Signed)
Pts belongings:   White jacket Black tshirt Jeans Black shoes Black belt Black underwear Cell phone

## 2023-10-07 NOTE — Consult Note (Signed)
Telepsych Consultation   Reason for Consult:  Psych Evaluation  Referring Physician:  Dr. Elesa Massed Location of Patient: [facility] ER Location of Provider: Behavioral Health TTS Department  Patient Identification: Devon Thompson MRN:  [id number] Principal Diagnosis: Methamphetamine abuse [facility]) Diagnosis:  Principal Problem:   Methamphetamine abuse (HCC) Active Problems:   Stress reaction causing mixed disturbance of emotion and conduct   Total Time spent with patient: 30 minutes  Subjective:   "Ive been paranoid"  HPI:  Tele psych Assessment   Devon Thompson, 32 y.o., male patient seen via tele health by TTS and this provider; chart reviewed and consulted with Dr. Joylene Thompson on 10/07/23.  Per chart review, patient presented to [facility] on 09/16/2023. The chart stated, Pt denied Si, denied HI, endorsed hearing voices occasionally when using meth- voices are not command. Pt was hearing voices at this time. Pt with last meth use 4 hours ago, with daily use. Pt last used Suboxone yesterday."  On evaluation Devon Thompson reports My girlfriend put cameras in truck.  I also feel like she has been cheating. He stated that he feels like he may be "bipolar".  I haven't used meth in years until two days ago.  Chart review reveals that he uses meth daily and has been since being released from prison.  At this time, he has not provided urine for a UDS.  Says he was at a party when he used the ICE two days ago.  He also reports being prescribed subutex.  He was recently released from prison 5 months ago.  Says he was there for 10 months.   Patient has denied symtpoms of psycohsis, but agrees that he has experineced some of them while intoxicated by methamphteamines. He has not demonstrated psychosis symptoms while he is here in our ED.  Says he does not feel like he needs to be admitted.  Patient does not meet criteria for involuntary commitment, our only option is to really refer him for outpatient  treatment and we have provided him with walk-in information to several outpatient clinics.    During evaluation Devon Thompson is sitting up in bed; he  is alert/oriented x 4; calm/cooperative; and mood congruent with affect.  Patient is speaking in a clear tone at moderate volume, and normal pace; with good eye contact.  His  thought process is coherent and relevant; There is no indication that he is currently responding to internal/external stimuli or experiencing delusional thought content.  Patient denies suicidal/self-harm/homicidal ideation. He says at times he feels paranoid as if someone is watching him or thinking that there is someone in the house.  He states while in prison, he was on medication but was not able to recall the names.  At this time he denies psychosis, and paranoia.  Patient has remained calm throughout assessment and has answered questions appropriately.     Recommendations: Patient is psych cleared    Dr. Elesa Massed informed of above recommendation and disposition    Past Psychiatric History: Methamphetamine abuse  Risk to Self:   Risk to Others:   Prior Inpatient Therapy:   Prior Outpatient Therapy:    Past Medical History: History reviewed. No pertinent past medical history. History reviewed. No pertinent surgical history. Family History: History reviewed. No pertinent family history. Family Psychiatric  History: unknown Social History:  Social History   Substance and Sexual Activity  Alcohol Use Not Currently     Social History   Substance and Sexual  Activity  Drug Use Yes    Social History   Socioeconomic History   Marital status: Married    Spouse name: Not on file   Number of children: Not on file   Years of education: Not on file   Highest education level: Not on file  Occupational History   Not on file  Tobacco Use   Smoking status: Every Day   Smokeless tobacco: Never  Vaping Use   Vaping status: Every Day  Substance and Sexual  Activity   Alcohol use: Not Currently   Drug use: Yes   Sexual activity: Not on file  Other Topics Concern   Not on file  Social History Narrative   Not on file   Social Determinants of Health   Financial Resource Strain: Not on file  Food Insecurity: Not on file  Transportation Needs: Not on file  Physical Activity: Not on file  Stress: Not on file  Social Connections: Not on file   Additional Social History:    Allergies:   Allergies  Allergen Reactions   Penicillins Swelling and Other (See Comments)    Throat swells   Amoxicillin Swelling and Other (See Comments)    Tongue swells    Labs:  Results for orders placed or performed during the hospital encounter of 10/07/23 (from the past 48 hour(s))  Comprehensive metabolic panel     Status: Abnormal   Collection Time: 10/07/23 12:59 AM  Result Value Ref Range   Sodium 136 135 - 145 mmol/L   Potassium 4.3 3.5 - 5.1 mmol/L   Chloride 102 98 - 111 mmol/L   CO2 27 22 - 32 mmol/L   Glucose, Bld 98 70 - 99 mg/dL    Comment: Glucose reference range applies only to samples taken after fasting for at least 8 hours.   BUN 13 6 - 20 mg/dL   Creatinine, Ser 1.61 0.61 - 1.24 mg/dL   Calcium 8.8 (L) 8.9 - 10.3 mg/dL   Total Protein 7.8 6.5 - 8.1 g/dL   Albumin 4.3 3.5 - 5.0 g/dL   AST 20 15 - 41 U/L   ALT 25 0 - 44 U/L   Alkaline Phosphatase 71 38 - 126 U/L   Total Bilirubin 0.5 <1.2 mg/dL   GFR, Estimated >09 >60 mL/min    Comment: (NOTE) Calculated using the CKD-EPI Creatinine Equation (2021)    Anion gap 7 5 - 15    Comment: Performed at [facility], [address] [place], [place] [address]  Ethanol     Status: None   Collection Time: 10/07/23 12:59 AM  Result Value Ref Range   Alcohol, Ethyl (B) <10 <10 mg/dL    Comment: (NOTE) Lowest detectable limit for serum alcohol is 10 mg/dL.  For medical purposes only. Performed at [facility], [address] [place], [place] [address]    Salicylate level     Status: Abnormal   Collection Time: 10/07/23 12:59 AM  Result Value Ref Range   Salicylate Lvl <7.0 (L) 7.0 - 30.0 mg/dL    Comment: Performed at [facility], [address] [place], [place] [address]  Acetaminophen level     Status: Abnormal   Collection Time: 10/07/23 12:59 AM  Result Value Ref Range   Acetaminophen (Tylenol), Serum <10 (L) 10 - 30 ug/mL    Comment: (NOTE) Therapeutic concentrations vary significantly. A range of 10-30 ug/mL  may be an effective concentration for many patients. However, some  are best treated  at concentrations outside of this range. Acetaminophen concentrations >150 ug/mL at 4 hours after ingestion  and >50 ug/mL at 12 hours after ingestion are often associated with  toxic reactions.  Performed at [facility], [address] [place], [place] [address]   cbc     Status: Abnormal   Collection Time: 10/07/23 12:59 AM  Result Value Ref Range   WBC 11.8 (H) 4.0 - 10.5 K/uL   RBC 5.18 4.22 - 5.81 MIL/uL   Hemoglobin 16.0 13.0 - 17.0 g/dL   HCT 19.1 47.8 - 29.5 %   MCV 91.9 80.0 - 100.0 fL   MCH 30.9 26.0 - 34.0 pg   MCHC 33.6 30.0 - 36.0 g/dL   RDW 62.1 30.8 - 65.7 %   Platelets 337 150 - 400 K/uL   nRBC 0.0 0.0 - 0.2 %    Comment: Performed at [facility], [address] [place], [place] [address]    Medications:  No current facility-administered medications for this encounter.   Current Outpatient Medications  Medication Sig Dispense Refill   FLUoxetine (PROZAC) 20 MG capsule Take 1 capsule (20 mg total) by mouth daily. 30 capsule 1   naloxone (NARCAN) nasal spray 4 mg/0.1 mL Use as needed for opioid overdose. 2 each 1   risperiDONE (RISPERDAL) 0.5 MG tablet Take 1 tablet (0.5 mg total) by mouth 2 (two) times daily. 60 tablet 1    Musculoskeletal: Strength & Muscle Tone: within normal limits Gait & Station: normal Patient leans: N/A  Psychiatric Specialty Exam:  Presentation   General Appearance:  Appropriate for Environment; Casual  Eye Contact: Good  Speech: Clear and Coherent  Speech Volume: Normal  Handedness: Right   Mood and Affect  Mood: Anxious  Affect: Appropriate   Thought Process  Thought Processes: Coherent  Descriptions of Associations:Intact  Orientation:Full (Time, Place and Person)  Thought Content:Paranoid Ideation  History of Schizophrenia/Schizoaffective disorder:No  Duration of Psychotic Symptoms:N/A  Hallucinations:Hallucinations: Visual  Ideas of Reference:Paranoia  Suicidal Thoughts:Suicidal Thoughts: No  Homicidal Thoughts:Homicidal Thoughts: No   Sensorium  Memory: Immediate Fair; Remote Fair  Judgment: Fair  Insight: Fair   Art therapist  Concentration: Fair  Attention Span: Fair  Recall: Fair  Fund of Knowledge: Fair  Language: Fair   Psychomotor Activity  Psychomotor Activity:Psychomotor Activity: Normal   Assets  Assets: Desire for Improvement; Communication Skills; Financial Resources/Insurance; Housing; Physical Health; Vocational/Educational   Sleep  Sleep:Sleep: Fair    Physical Exam: Physical Exam Vitals and nursing note reviewed.    ROS Blood pressure (!) 136/112, pulse (!) 109, temperature 98.2 F (36.8 C), temperature source Oral, resp. rate 18, height 5\' 8"  (1.727 m), weight 77.1 kg, SpO2 100%. Body mass index is 25.85 kg/m.   Disposition: No evidence of imminent risk to self or others at present.   Patient does not meet criteria for psychiatric inpatient admission. Supportive therapy provided about ongoing stressors. Refer to IOP. Discussed crisis plan, support from social network, calling 911, coming to the Emergency Department, and calling Suicide Hotline. Patient provided with resources to outpatient providers This service was provided via telemedicine using a 2-way, interactive audio and Immunologist.   Jearld Lesch,  NP 10/07/2023 2:39 AM

## 2023-10-07 NOTE — ED Notes (Addendum)
Verified correct patient and correct discharge papers given. Pt alert and oriented X 4, stable for discharge. RR even and unlabored, color WNL. Discussed discharge instructions and follow-up as directed. Discharge medications discussed, when prescribed. Pt had opportunity to ask questions, and RN available to provide patient and/or family education. Pt using phone to call ex/significant other to get his car keys. Confirms that he has a safe place to go. Returned all belongings.

## 2023-10-07 NOTE — ED Notes (Signed)
Registration at bedside.

## 2023-10-07 NOTE — ED Notes (Signed)
TTS at bedside with telepsych provider completing assessment at this time.

## 2023-10-07 NOTE — BH Assessment (Signed)
Per NP Rashaun D., patient does not meet inpatient criteria and is psych cleared.   This Clinical research associate provided pt with outpatient resources with recommendation for immediate follow up. Pt encouraged to call 911 and return to the nearest emergency room if symptoms worsen.

## 2023-10-07 NOTE — ED Triage Notes (Signed)
Pt reports he is dating a millionaire who did modifications to his truck, pt reports when he got his truck back he noticed she put a gps tracker and cameras inside his vehicle. Pt reports feeling paranoid around significant other. Pt denies SI/HI. Pt anxious and unable to sit still in triage.

## 2023-10-10 ENCOUNTER — Other Ambulatory Visit: Payer: Self-pay

## 2023-10-10 ENCOUNTER — Emergency Department
Admission: EM | Admit: 2023-10-10 | Discharge: 2023-10-11 | Disposition: A | Discharge status: Other Acute Inpatient Hospital | Payer: Self-pay | Attending: Emergency Medicine | Admitting: Emergency Medicine

## 2023-10-10 DIAGNOSIS — F15959 Other stimulant use, unspecified with stimulant-induced psychotic disorder, unspecified: Secondary | ICD-10-CM | POA: Diagnosis present

## 2023-10-10 DIAGNOSIS — F1510 Other stimulant abuse, uncomplicated: Secondary | ICD-10-CM | POA: Diagnosis present

## 2023-10-10 DIAGNOSIS — F23 Brief psychotic disorder: Secondary | ICD-10-CM

## 2023-10-10 DIAGNOSIS — R45851 Suicidal ideations: Secondary | ICD-10-CM | POA: Insufficient documentation

## 2023-10-10 DIAGNOSIS — R443 Hallucinations, unspecified: Secondary | ICD-10-CM | POA: Diagnosis present

## 2023-10-10 DIAGNOSIS — F430 Acute stress reaction: Secondary | ICD-10-CM | POA: Diagnosis present

## 2023-10-10 DIAGNOSIS — F22 Delusional disorders: Secondary | ICD-10-CM

## 2023-10-10 HISTORY — DX: Schizophrenia, unspecified: F20.9

## 2023-10-10 HISTORY — DX: Essential (primary) hypertension: I10

## 2023-10-10 HISTORY — DX: Bipolar disorder, unspecified: F31.9

## 2023-10-10 LAB — COMPREHENSIVE METABOLIC PANEL
ALT: 25 U/L (ref 0–44)
AST: 20 U/L (ref 15–41)
Albumin: 3.9 g/dL (ref 3.5–5.0)
Alkaline Phosphatase: 63 U/L (ref 38–126)
Anion gap: 7 (ref 5–15)
BUN: 11 mg/dL (ref 6–20)
CO2: 28 mmol/L (ref 22–32)
Calcium: 8.7 mg/dL — ABNORMAL LOW (ref 8.9–10.3)
Chloride: 101 mmol/L (ref 98–111)
Creatinine, Ser: 0.94 mg/dL (ref 0.61–1.24)
GFR, Estimated: >60 mL/min (ref >60)
Glucose, Bld: 92 mg/dL (ref 70–99)
Potassium: 3.9 mmol/L (ref 3.5–5.1)
Sodium: 136 mmol/L (ref 135–145)
Total Bilirubin: 0.5 mg/dL (ref <1.2)
Total Protein: 7.2 g/dL (ref 6.5–8.1)

## 2023-10-10 LAB — CBC
HCT: 42.8 % (ref 39.0–52.0)
Hemoglobin: 14.7 g/dL (ref 13.0–17.0)
MCH: 30.8 pg (ref 26.0–34.0)
MCHC: 34.3 g/dL (ref 30.0–36.0)
MCV: 89.5 fL (ref 80.0–100.0)
Platelets: 284 10*3/uL (ref 150–400)
RBC: 4.78 MIL/uL (ref 4.22–5.81)
RDW: 12 % (ref 11.5–15.5)
WBC: 11.3 10*3/uL — ABNORMAL HIGH (ref 4.0–10.5)
nRBC: 0 % (ref 0.0–0.2)

## 2023-10-10 LAB — ACETAMINOPHEN LEVEL: Acetaminophen (Tylenol), Serum: <10 ug/mL — ABNORMAL LOW (ref 10–30)

## 2023-10-10 MED ORDER — LORAZEPAM 2 MG PO TABS
2.0000 mg | ORAL_TABLET | Freq: Once | ORAL | Status: AC
  Administered 2023-10-10: 2 mg via ORAL
  Filled 2023-10-10: qty 1

## 2023-10-10 MED ORDER — RISPERIDONE 1 MG PO TABS
1.0000 mg | ORAL_TABLET | Freq: Two times a day (BID) | ORAL | Status: DC
  Administered 2023-10-11: 1 mg via ORAL
  Filled 2023-10-10: qty 1

## 2023-10-10 NOTE — ED Notes (Signed)
Pt speaking to girlfriend, Nehemiah Settle, and hands this RN the phone. Per Nehemiah Settle pt has been having auditory and visual hallucinations for about 2 months and having severe mood swings. Nehemiah Settle describes it as being in a bad mood to almost manic and has been getting progressively worse over the last couple weeks.

## 2023-10-10 NOTE — ED Notes (Signed)
Pt brought to room, upon entering pt seen crying stating "I'm sorry" over and over also saying "I don't want to die."" Pt assured we are going to help him and he is in a safe place. Pt asking to talk to mom, this Rn said we can do that and we want to give him something to help calm him down. Pt seems agreeable to this. Pt talking to mom on phone.

## 2023-10-10 NOTE — Consult Note (Cosign Needed Addendum)
Telepsych Consultation   Reason for Consult:  Psych evaluation  Referring Physician:  Dr. Cyril Loosen Location of Patient: [facility] ER Location of Provider: Behavioral Health TTS Department  Patient Identification: Devon Thompson MRN:  [id number] Principal Diagnosis: Methamphetamine abuse [facility]) Diagnosis:  Principal Problem:   Methamphetamine abuse (HCC) Active Problems:   Stress reaction causing mixed disturbance of emotion and conduct   Hallucinations   Total Time spent with patient: 45 minutes  Subjective:  "I was having the same problems"   HPI:  Tele psych Assessment   Devon Thompson, 32 y.o., male patient seen via tele health by TTS and this provider; chart reviewed and consulted with Dr. Cyril Loosen on 10/10/23.  On evaluation Devon Thompson reports that when he left the hospital the last time, he says "it got worst".  He says his paranoia has increased.  He denies going to [facility] to resume care but he says he went to [facility]. He states that he received a "shot and was sent on his way".    Per chart review, patient was prescribed risperidone in 2022.  This provider has ordered risperidone on this encounter for his psychosis and paranoia that he still endorses.     During evaluation Devon Thompson is laying down and lethargic.  It is difficult to keep him engaged in the assessment.  At times, it also appears that he is responding to internal stimuli.  When asked questions, there appears to be thought blocking and hesitation before answering.  His mood is labile and sometimes became frustrated with answering assessment questions.  He is alert/oriented x 4; labile/anxious/somber/ mostly cooperative; and mood congruent with affect.  His thought process is slowed and blocked.   Patient endorses suicidal/self-harm and denies homicidal ideation.  Patient is experiencing psychosis, and paranoia.    Recommendations:  Inpatient hospitalization    Dr. Cyril Loosen informed of above recommendation and  disposition  Past Psychiatric History: Acute psychosis, polysubstance abuse  Risk to Self:   Risk to Others:   Prior Inpatient Therapy:   Prior Outpatient Therapy:    Past Medical History:  Past Medical History:  Diagnosis Date   Bipolar 1 disorder (HCC)    HTN (hypertension)    Schizophrenia (HCC)    History reviewed. No pertinent surgical history. Family History: History reviewed. No pertinent family history. Family Psychiatric  History: unknown Social History:  Social History   Substance and Sexual Activity  Alcohol Use Not Currently     Social History   Substance and Sexual Activity  Drug Use Yes    Social History   Socioeconomic History   Marital status: Married    Spouse name: Not on file   Number of children: Not on file   Years of education: Not on file   Highest education level: Not on file  Occupational History   Not on file  Tobacco Use   Smoking status: Every Day   Smokeless tobacco: Never  Vaping Use   Vaping status: Every Day  Substance and Sexual Activity   Alcohol use: Not Currently   Drug use: Yes   Sexual activity: Not on file  Other Topics Concern   Not on file  Social History Narrative   Not on file   Social Determinants of Health   Financial Resource Strain: Not on file  Food Insecurity: Not on file  Transportation Needs: Not on file  Physical Activity: Not on file  Stress: Not on file  Social Connections: Not on file  Additional Social History:    Allergies:   Allergies  Allergen Reactions   Penicillins Swelling and Other (See Comments)    Throat swells   Amoxicillin Swelling and Other (See Comments)    Tongue swells    Labs:  Results for orders placed or performed during the hospital encounter of 10/10/23 (from the past 48 hour(s))  CBC     Status: Abnormal   Collection Time: 10/10/23  3:55 PM  Result Value Ref Range   WBC 11.3 (H) 4.0 - 10.5 K/uL   RBC 4.78 4.22 - 5.81 MIL/uL   Hemoglobin 14.7 13.0 - 17.0 g/dL    HCT 13.0 86.5 - 78.4 %   MCV 89.5 80.0 - 100.0 fL   MCH 30.8 26.0 - 34.0 pg   MCHC 34.3 30.0 - 36.0 g/dL   RDW 69.6 29.5 - 28.4 %   Platelets 284 150 - 400 K/uL   nRBC 0.0 0.0 - 0.2 %    Comment: Performed at [facility], [address], [place], [place] [address]  Comprehensive metabolic panel     Status: Abnormal   Collection Time: 10/10/23  3:55 PM  Result Value Ref Range   Sodium 136 135 - 145 mmol/L   Potassium 3.9 3.5 - 5.1 mmol/L   Chloride 101 98 - 111 mmol/L   CO2 28 22 - 32 mmol/L   Glucose, Bld 92 70 - 99 mg/dL    Comment: Glucose reference range applies only to samples taken after fasting for at least 8 hours.   BUN 11 6 - 20 mg/dL   Creatinine, Ser 0.10 0.61 - 1.24 mg/dL   Calcium 8.7 (L) 8.9 - 10.3 mg/dL   Total Protein 7.2 6.5 - 8.1 g/dL   Albumin 3.9 3.5 - 5.0 g/dL   AST 20 15 - 41 U/L   ALT 25 0 - 44 U/L   Alkaline Phosphatase 63 38 - 126 U/L   Total Bilirubin 0.5 <1.2 mg/dL   GFR, Estimated >27 >25 mL/min    Comment: (NOTE) Calculated using the CKD-EPI Creatinine Equation (2021)    Anion gap 7 5 - 15    Comment: Performed at [facility], [address], [place], [place] [address]  Acetaminophen level     Status: Abnormal   Collection Time: 10/10/23  3:55 PM  Result Value Ref Range   Acetaminophen (Tylenol), Serum <10 (L) 10 - 30 ug/mL    Comment: (NOTE) Therapeutic concentrations vary significantly. A range of 10-30 ug/mL  may be an effective concentration for many patients. However, some  are best treated at concentrations outside of this range. Acetaminophen concentrations >150 ug/mL at 4 hours after ingestion  and >50 ug/mL at 12 hours after ingestion are often associated with  toxic reactions.  Performed at [facility], [address] [place], [place] [address]     Medications:  No current facility-administered medications for this encounter.   Current Outpatient Medications  Medication Sig Dispense Refill    FLUoxetine (PROZAC) 20 MG capsule Take 1 capsule (20 mg total) by mouth daily. 30 capsule 1   naloxone (NARCAN) nasal spray 4 mg/0.1 mL Use as needed for opioid overdose. 2 each 1   risperiDONE (RISPERDAL) 0.5 MG tablet Take 1 tablet (0.5 mg total) by mouth 2 (two) times daily. 60 tablet 1    Musculoskeletal: Strength & Muscle Tone: within normal limits Gait & Station: normal Patient leans: N/A  Psychiatric Specialty Exam:  Presentation  General Appearance:  Appropriate  for Environment; Casual  Eye Contact: Good  Speech: Clear and Coherent  Speech Volume: Normal  Handedness: Right   Mood and Affect  Mood: Anxious  Affect: Appropriate   Thought Process  Thought Processes: Coherent  Descriptions of Associations:Intact  Orientation:Full (Time, Place and Person)  Thought Content:Paranoid Ideation  History of Schizophrenia/Schizoaffective disorder:No  Duration of Psychotic Symptoms:N/A  Hallucinations:No data recorded Ideas of Reference:Paranoia  Suicidal Thoughts:No data recorded Homicidal Thoughts:No data recorded  Sensorium  Memory: Immediate Fair; Remote Fair  Judgment: Fair  Insight: Fair   Art therapist  Concentration: Fair  Attention Span: Fair  Recall: [employer] of Knowledge: Fair  Language: Fair   Psychomotor Activity  Psychomotor Activity:No data recorded  Assets  Assets: Desire for Improvement; Communication Skills; Financial Resources/Insurance; Housing; Physical Health; Vocational/Educational   Sleep  Sleep:No data recorded   Physical Exam: Physical Exam Vitals and nursing note reviewed.  Constitutional:      General: He is in acute distress.     Appearance: He is toxic-appearing.  HENT:     Head: Normocephalic and atraumatic.     Nose: Nose normal.     Mouth/Throat:     Mouth: Mucous membranes are dry.  Eyes:     Pupils: Pupils are equal, round, and reactive to light.  Pulmonary:      Effort: Pulmonary effort is normal.  Musculoskeletal:        General: Normal range of motion.     Cervical back: Normal range of motion.  Skin:    General: Skin is dry.  Neurological:     Mental Status: He is oriented to person, place, and time.  Psychiatric:        Attention and Perception: He is inattentive. He perceives auditory hallucinations.        Mood and Affect: Mood is anxious and depressed. Affect is tearful and inappropriate.        Speech: Speech is delayed.        Behavior: Behavior is agitated. Behavior is cooperative.        Thought Content: Thought content is paranoid and delusional. Thought content includes suicidal ideation. Thought content does not include suicidal plan.        Cognition and Memory: Cognition is impaired. Memory is impaired.        Judgment: Judgment is impulsive and inappropriate.    Review of Systems  Psychiatric/Behavioral:  Positive for depression, hallucinations, substance abuse and suicidal ideas. The patient is nervous/anxious and has insomnia.   All other systems reviewed and are negative.  Blood pressure 110/81, pulse 82, temperature 97.9 F (36.6 C), temperature source Oral, resp. rate 18, height 5\' 8"  (1.727 m), weight 77.1 kg, SpO2 99%. Body mass index is 25.85 kg/m.  Treatment Plan Summary: Daily contact with patient to assess and evaluate symptoms and progress in treatment, Medication management, and Plan  JOHNATHON DOBBYN was admitted to [facility] ER for Methamphetamine abuse [facility]), crisis management, and stabilization. Routine labs ordered, which include  Lab Orders         CBC         Comprehensive metabolic panel         Urine Drug Screen, Qualitative ([facility] only)         Acetaminophen level    Medication Management: Medications started  risperiDONE  1 mg Oral BID   Will maintain observation checks every 15 minutes for safety. Psychosocial education regarding relapse prevention and self-care; social and communication  Social  work  will consult with family for collateral information and discuss discharge and follow up plan.  Disposition: Recommend psychiatric Inpatient admission when medically cleared. Supportive therapy provided about ongoing stressors. Refer to IOP. Discussed crisis plan, support from social network, calling 911, coming to the Emergency Department, and calling Suicide Hotline.  This service was provided via telemedicine using a 2-way, interactive audio and video technology.   Jearld Lesch, NP 10/10/2023 9:11 PM

## 2023-10-10 NOTE — ED Notes (Signed)
Pt sleeping in bed, NAD at this time

## 2023-10-10 NOTE — ED Notes (Signed)
Pt spoke to mom, mom said to call back in ten minutes. Pt agreeable to take medicine, will attempt blood draw after pt is able to calm down some.

## 2023-10-10 NOTE — BH Assessment (Signed)
TTS was unable to complete the consult. Patient is unable to participate in the interview.

## 2023-10-10 NOTE — ED Notes (Signed)
Pt dressed out into beh scrubs with Reuel Boom, [employer] and Madelaine Bhat, [place]. Pt belongings placed in belongings bag and include the following: 1 pair of black and red tennis shoes 1 black hat 1 pair blue jeans 1 black shirt 1 black belt Dark green jacket Black phone Coin change

## 2023-10-10 NOTE — ED Notes (Signed)
Pt vitals taken and pt received snack at this time.

## 2023-10-10 NOTE — Consult Note (Signed)
Patient noted lying in the bed. He is unable to be awakened. Patient received medications recently for calming. Unable to complete evaluation at this time.

## 2023-10-10 NOTE — BH Assessment (Signed)
Comprehensive Clinical Assessment (CCA) Note  10/10/2023 Devon Thompson [id number] Recommendations for Services/Supports/Treatments: Psych NP Rashaun D. determined pt. meets psychiatric inpatient criteria. Devon Thompson is a 32 y.o., ENGLISH speaking male with a hx of Methamphetamine abuse, stress reaction causing mixed disturbance of emotion and conduct, and acute psychosis. Pt is VOL. Per triage note: Pt presents with auditory hallucinations with commands to hurt himself. Pt very paranoid during triage and stating "I don't want to die. I don't want you to kill me." Pt states he has cut himself in the past month with intent of suicide.   On assessment, pt was drowsy, but able to participate in assessment. Pt presented with a irritable mood and a responsive affect. Pt was defensive when asked questions about substance use. Pt denied substance use in the past week; excluding daily Subutex. The pt. denied having any services, explaining that he'd gone to [facility] where he'd gotten a shot and then was discharged. Pt expressed a desire for help for his mental health, explaining that his paranoia is worsening. Pt admitted to having symptoms of panic that led to him having thoughts of SI early today. Pt continues to endorse SI/HI. Pt did not have a plan, nor has he had any previous suicide attempts. Pt had poor insight and impaired judgment. Pt presented with relevant thought processes and normal psychomotor activity.  Chief Complaint:  Chief Complaint  Patient presents with   Suicidal   Visit Diagnosis: Methamphetamine abuse (HCC) Active Problems:   Stress reaction causing mixed disturbance of emotion and conduct   Hallucinations     CCA Screening, Triage and Referral (STR)  Patient Reported Information How did you hear about [place]? Family/Friend  Referral name: No data recorded Referral phone number: No data recorded  Whom do you see for routine medical problems? No data  recorded Practice/Facility Name: No data recorded Practice/Facility Phone Number: No data recorded Name of Contact: No data recorded Contact Number: No data recorded Contact Fax Number: No data recorded Prescriber Name: No data recorded Prescriber Address (if known): No data recorded  What Is the Reason for Your Visit/Call Today? Patient presents to the [facility] due to complications with his bipolar disorder.  Patient has not been sleeping, he has experienced a decreased appetite, his anxiety level is high and he states that he has been irritable.  Patient states that he is not suicidal, homicidal or psychotic.  Prior suicide attempt more than a year ago by hanging while incarcerated.  Patient states that he has been abusing Ice, but states that he has not had any in the past two days.  No alcohol or other drug use.  Patient states that he is currently on probation. Patient states that he is depressed and feels like he needs to be on medication.  Patient states that he just got out of rehab from [facility] and states that he was clean for four months.  States that he has only relapsed twice.  Patient is routine.  How Long Has This Been Causing You Problems? 1 wk - 1 month  What Do You Feel Would Help You the Most Today? Treatment for Depression or other mood problem   Have You Recently Been in Any Inpatient Treatment (Hospital/Detox/Crisis Center/28-Day Program)? No data recorded Name/Location of Program/Hospital:No data recorded How Long Were You There? No data recorded When Were You Discharged? No data recorded  Have You Ever Received Services From [facility] Before? No data recorded Who Do You See at [facility]  Health? No data recorded  Have You Recently Had Any Thoughts About Hurting Yourself? No  Are You Planning to Commit Suicide/Harm Yourself At This time? No   Have you Recently Had Thoughts About Hurting Someone Karolee Ohs? No  Explanation: No data recorded  Have You Used Any  Alcohol or Drugs in the Past 24 Hours? No  How Long Ago Did You Use Drugs or Alcohol? No data recorded What Did You Use and How Much? No data recorded  Do You Currently Have a Therapist/Psychiatrist? No data recorded Name of Therapist/Psychiatrist: No data recorded  Have You Been Recently Discharged From Any Office Practice or Programs? No data recorded Explanation of Discharge From Practice/Program: No data recorded    CCA Screening Triage Referral Assessment Type of Contact: No data recorded Is this Initial or Reassessment? No data recorded Date Telepsych consult ordered in [facility]L:  No data recorded Time Telepsych consult ordered in CHL:  No data recorded  Patient Reported Information Reviewed? No data recorded Patient Left Without Being Seen? No data recorded Reason for Not Completing Assessment: No data recorded  Collateral Involvement: No data recorded  Does Patient Have a Court Appointed Legal Guardian? No data recorded Name and Contact of Legal Guardian: No data recorded If Minor and Not Living with Parent(s), Who has Custody? No data recorded Is CPS involved or ever been involved? No data recorded Is APS involved or ever been involved? No data recorded  Patient Determined To Be At Risk for Harm To Self or Others Based on Review of Patient Reported Information or Presenting Complaint? No data recorded Method: No data recorded Availability of Means: No data recorded Intent: No data recorded Notification Required: No data recorded Additional Information for Danger to Others Potential: No data recorded Additional Comments for Danger to Others Potential: No data recorded Are There Guns or Other Weapons in Your Home? No data recorded Types of Guns/Weapons: No data recorded Are These Weapons Safely Secured?                            No data recorded Who Could Verify You Are Able To Have These Secured: No data recorded Do You Have any Outstanding Charges, Pending Court Dates,  Parole/Probation? No data recorded Contacted To Inform of Risk of Harm To Self or Others: No data recorded  Location of Assessment: No data recorded  Does Patient Present under Involuntary Commitment? No data recorded IVC Papers Initial File Date: No data recorded  [place] of Residence: No data recorded  Patient Currently Receiving the Following Services: No data recorded  Determination of Need: Routine (7 days)   Options For Referral: Medication Management     CCA Biopsychosocial Intake/Chief Complaint:  No data recorded Current Symptoms/Problems: No data recorded  Patient Reported Schizophrenia/Schizoaffective Diagnosis in Past: No   Strengths: No data recorded Preferences: No data recorded Abilities: No data recorded  Type of Services Patient Feels are Needed: No data recorded  Initial Clinical Notes/Concerns: No data recorded  Mental Health Symptoms Depression:  No data recorded  Duration of Depressive symptoms: No data recorded  Mania:  No data recorded  Anxiety:   No data recorded  Psychosis:  No data recorded  Duration of Psychotic symptoms:  N/A   Trauma:  No data recorded  Obsessions:  No data recorded  Compulsions:  No data recorded  Inattention:  No data recorded  Hyperactivity/Impulsivity:  No data recorded  Oppositional/Defiant Behaviors:  No data recorded  Emotional Irregularity:  No data recorded  Other Mood/Personality Symptoms:  No data recorded   Mental Status Exam Appearance and self-care  Stature:  No data recorded  Weight:  No data recorded  Clothing:  No data recorded  Grooming:  No data recorded  Cosmetic use:  No data recorded  Posture/gait:  No data recorded  Motor activity:  No data recorded  Sensorium  Attention:  No data recorded  Concentration:  No data recorded  Orientation:  No data recorded  Recall/memory:  No data recorded  Affect and Mood  Affect:  No data recorded  Mood:  No data recorded  Relating  Eye contact:   No data recorded  Facial expression:  No data recorded  Attitude toward examiner:  No data recorded  Thought and Language  Speech flow: No data recorded  Thought content:  No data recorded  Preoccupation:  No data recorded  Hallucinations:  No data recorded  Organization:  No data recorded  [employer] of Knowledge:  No data recorded  Intelligence:  No data recorded  Abstraction:  No data recorded  Judgement:  No data recorded  Reality Testing:  No data recorded  Insight:  No data recorded  Decision Making:  No data recorded  Social Functioning  Social Maturity:  No data recorded  Social Judgement:  No data recorded  Stress  Stressors:  No data recorded  Coping Ability:  No data recorded  Skill Deficits:  No data recorded  Supports:  No data recorded    Religion:    Leisure/Recreation:    Exercise/Diet:     CCA Employment/Education Employment/Work Situation:    Education:     CCA Family/Childhood History Family and Relationship History:    Childhood History:     Child/Adolescent Assessment:     CCA Substance Use Alcohol/Drug Use:                           ASAM's:  Six Dimensions of Multidimensional Assessment  Dimension 1:  Acute Intoxication and/or Withdrawal Potential:      Dimension 2:  Biomedical Conditions and Complications:      Dimension 3:  Emotional, Behavioral, or Cognitive Conditions and Complications:     Dimension 4:  Readiness to Change:     Dimension 5:  Relapse, Continued use, or Continued Problem Potential:     Dimension 6:  Recovery/Living Environment:     ASAM Severity Score:    ASAM Recommended Level of Treatment:     Substance use Disorder (SUD)    Recommendations for Services/Supports/Treatments:    DSM5 Diagnoses: Patient Active Problem List   Diagnosis Date Noted   Methamphetamine abuse (HCC) 10/07/2023   Paranoia (HCC) 10/07/2023   Hallucinations 10/07/2023   Stress reaction causing  mixed disturbance of emotion and conduct 08/15/2021   Riannon Mukherjee R Bubba Vanbenschoten, LCAS

## 2023-10-10 NOTE — ED Provider Notes (Signed)
   [facility] Provider Note    Event Date/Time   First MD Initiated Contact with Patient 10/10/23 1245     (approximate)   History  Paranoia, hallucinations   HPI  Devon Thompson is a 32 y.o. male with a history of polysubstance abuse but primarily uses methamphetamines who presents with paranoia, anxiety, mania, reports of auditory and visual hallucinations over the last several weeks per family.  The patient is very anxious and paranoid about speaking with me     Physical Exam   Triage Vital Signs: ED Triage Vitals [10/10/23 1238]  Encounter Vitals Group     BP      Systolic BP Percentile      Diastolic BP Percentile      Pulse      Resp      Temp      Temp src      SpO2      Weight 77.1 kg (170 lb)     Height 1.727 m (5\' 8" )     Head Circumference      Peak Flow      Pain Score 0     Pain Loc      Pain Education      Exclude from Growth Chart     Most recent vital signs: There were no vitals filed for this visit.   General: Awake, no distress.  CV:  Good peripheral perfusion.  Resp:  Normal effort.  Abd:  No distention.  Other:  Patient standing in the corner of the room, appears fearful and anxious and asking to speak to his mother   ED Results / Procedures / Treatments   Labs (all labs ordered are listed, but only abnormal results are displayed) Labs Reviewed  CBC  COMPREHENSIVE METABOLIC PANEL  URINE DRUG SCREEN, QUALITATIVE ([facility] ONLY)  ACETAMINOPHEN LEVEL     EKG     RADIOLOGY     PROCEDURES:  Critical Care performed:   Procedures   MEDICATIONS ORDERED IN ED: Medications  LORazepam (ATIVAN) tablet 2 mg (2 mg Oral Given 10/10/23 1314)     IMPRESSION / MDM / ASSESSMENT AND PLAN / ED COURSE  I reviewed the triage vital signs and the nursing notes. Patient's presentation is most consistent with severe exacerbation of chronic illness.  Patient presents with paranoia, reports of hallucinations,  anxiety.  Review of records demonstrates the patient has been seen for paranoia related to methamphetamine use in our emergency department before  Patient has voluntarily taken p.o. Ativan to help calm him, will attempt to obtain labs, have consulted TTS and psychiatry.  Patient is willing to stay, will not commit involuntarily at this time        FINAL CLINICAL IMPRESSION(S) / ED DIAGNOSES   Final diagnoses:  Paranoia (HCC)     Rx / DC Orders   ED Discharge Orders     None        Note:  This document was prepared using Dragon voice recognition software and may include unintentional dictation errors.   Jene Every, MD 10/10/23 1444

## 2023-10-10 NOTE — ED Triage Notes (Addendum)
Pt presents with auditory hallucinations with commands to hurt himself. Pt very paranoid during triage and stating "I don't want to die. I don't want you to kill me." Pt states he has cut himself in the past month with intent of suicide. Pt will not allow this RN to obtain V/S and is asking for a different RN. Pt denies being on any medication.

## 2023-10-11 ENCOUNTER — Inpatient Hospital Stay (HOSPITAL_COMMUNITY)
Admission: AD | Admit: 2023-10-11 | Discharge: 2023-10-16 | DRG: 897 | Disposition: A | Discharge status: Home / Self Care | Payer: No Typology Code available for payment source | Source: Intra-hospital | Attending: Psychiatry | Admitting: Psychiatry

## 2023-10-11 ENCOUNTER — Other Ambulatory Visit: Payer: Self-pay

## 2023-10-11 ENCOUNTER — Encounter (HOSPITAL_COMMUNITY): Payer: Self-pay | Admitting: Psychiatry

## 2023-10-11 DIAGNOSIS — Z79899 Other long term (current) drug therapy: Secondary | ICD-10-CM | POA: Diagnosis not present

## 2023-10-11 DIAGNOSIS — I10 Essential (primary) hypertension: Secondary | ICD-10-CM | POA: Diagnosis present

## 2023-10-11 DIAGNOSIS — F17210 Nicotine dependence, cigarettes, uncomplicated: Secondary | ICD-10-CM | POA: Diagnosis present

## 2023-10-11 DIAGNOSIS — F319 Bipolar disorder, unspecified: Secondary | ICD-10-CM | POA: Diagnosis present

## 2023-10-11 DIAGNOSIS — Z880 Allergy status to penicillin: Secondary | ICD-10-CM | POA: Diagnosis not present

## 2023-10-11 DIAGNOSIS — Z9151 Personal history of suicidal behavior: Secondary | ICD-10-CM

## 2023-10-11 DIAGNOSIS — F15159 Other stimulant abuse with stimulant-induced psychotic disorder, unspecified: Secondary | ICD-10-CM | POA: Diagnosis present

## 2023-10-11 DIAGNOSIS — F15959 Other stimulant use, unspecified with stimulant-induced psychotic disorder, unspecified: Principal | ICD-10-CM | POA: Diagnosis present

## 2023-10-11 DIAGNOSIS — R400 Somnolence: Secondary | ICD-10-CM | POA: Diagnosis present

## 2023-10-11 LAB — URINE DRUG SCREEN, QUALITATIVE (ARMC ONLY)
Amphetamines, Ur Screen: POSITIVE — AB
Barbiturates, Ur Screen: NOT DETECTED
Benzodiazepine, Ur Scrn: POSITIVE — AB
Cannabinoid 50 Ng, Ur ~~LOC~~: NOT DETECTED
Cocaine Metabolite,Ur ~~LOC~~: POSITIVE — AB
MDMA (Ecstasy)Ur Screen: NOT DETECTED
Methadone Scn, Ur: NOT DETECTED
Opiate, Ur Screen: NOT DETECTED
Phencyclidine (PCP) Ur S: NOT DETECTED
Tricyclic, Ur Screen: NOT DETECTED

## 2023-10-11 MED ORDER — DIPHENHYDRAMINE HCL 50 MG/ML IJ SOLN: 50.0000 mg | Freq: Two times a day (BID) | INTRAMUSCULAR | Status: DC | PRN

## 2023-10-11 MED ORDER — DIPHENHYDRAMINE HCL 25 MG PO CAPS: 50.0000 mg | ORAL_CAPSULE | Freq: Two times a day (BID) | ORAL | Status: DC | PRN

## 2023-10-11 MED ORDER — DIPHENHYDRAMINE HCL 25 MG PO CAPS
50.0000 mg | ORAL_CAPSULE | Freq: Two times a day (BID) | ORAL | Status: DC | PRN
  Administered 2023-10-15: 50 mg via ORAL
  Filled 2023-10-11: qty 2

## 2023-10-11 MED ORDER — FLUOXETINE HCL 20 MG PO CAPS
20.0000 mg | ORAL_CAPSULE | Freq: Every day | ORAL | Status: DC
  Administered 2023-10-11: 20 mg via ORAL
  Filled 2023-10-11: qty 1

## 2023-10-11 MED ORDER — RISPERIDONE 1 MG PO TABS
1.0000 mg | ORAL_TABLET | Freq: Two times a day (BID) | ORAL | Status: DC
  Administered 2023-10-11 – 2023-10-12 (×2): 1 mg via ORAL
  Filled 2023-10-11 (×7): qty 1

## 2023-10-11 MED ORDER — OLANZAPINE 10 MG PO TABS: 10.0000 mg | ORAL_TABLET | Freq: Two times a day (BID) | ORAL | Status: DC | PRN

## 2023-10-11 MED ORDER — ALUM & MAG HYDROXIDE-SIMETH 200-200-20 MG/5ML PO SUSP: 30.0000 mL | ORAL | Status: DC | PRN

## 2023-10-11 MED ORDER — ACETAMINOPHEN 325 MG PO TABS: 650.0000 mg | ORAL_TABLET | Freq: Four times a day (QID) | ORAL | Status: DC | PRN

## 2023-10-11 MED ORDER — OLANZAPINE 10 MG IM SOLR: 10.0000 mg | Freq: Two times a day (BID) | INTRAMUSCULAR | Status: DC | PRN

## 2023-10-11 MED ORDER — FLUOXETINE HCL 20 MG PO CAPS
20.0000 mg | ORAL_CAPSULE | Freq: Every day | ORAL | Status: DC
  Administered 2023-10-11 – 2023-10-14 (×4): 20 mg via ORAL
  Filled 2023-10-11 (×7): qty 1

## 2023-10-11 MED ORDER — MAGNESIUM HYDROXIDE 400 MG/5ML PO SUSP: 30.0000 mL | Freq: Every day | ORAL | Status: DC | PRN

## 2023-10-11 NOTE — ED Notes (Signed)
PT VOL/ RECOMMENDED FOR INPATIENT PSYCH WHEN MEDICALLY CLEARED.

## 2023-10-11 NOTE — ED Notes (Signed)
Ambulated to [employer] by [employer]. Pt alert and oriented X4, cooperative, RR even and unlabored, color WNL. Pt in NAD. 2 bags of belongings given to transport, appropriate paperwork, voluntary consent, rider waiver and consent to transfer.

## 2023-10-11 NOTE — Tx Team (Signed)
Initial Treatment Plan 10/11/2023 3:45 PM NETHAN LAMPHEAR QIH:[id number]    PATIENT STRESSORS: Marital or family conflict   Substance abuse   Traumatic event     PATIENT STRENGTHS: Motivation for treatment/growth    PATIENT IDENTIFIED PROBLEMS:   "Paranoia"    "Substance Abuse" (Meth abuse)    "Stress"           DISCHARGE CRITERIA:  Improved stabilization in mood, thinking, and/or behavior  PRELIMINARY DISCHARGE PLAN: Outpatient therapy  PATIENT/FAMILY INVOLVEMENT: This treatment plan has been presented to and reviewed with the patient, LAMARIO MU. The patient has been given the opportunity to ask questions and make suggestions.  Earma Reading Sebastian Dzik, RN 10/11/2023, 3:45 PM

## 2023-10-11 NOTE — Plan of Care (Signed)
  Problem: Education: Goal: Knowledge of [facility] Education information/materials will improve Outcome: Progressing Goal: Verbalization of understanding the information provided will improve Outcome: Progressing   

## 2023-10-11 NOTE — ED Notes (Signed)
SAFE  TRANSPORT  CALLED  FOR  TRANSFER  TO  [facility]

## 2023-10-11 NOTE — Tx Team (Signed)
Initial Treatment Plan 10/11/2023 4:09 PM Devon Thompson [id number]    PATIENT STRESSORS: Substance abuse     PATIENT STRENGTHS: Supportive family/friends    PATIENT IDENTIFIED PROBLEMS: "I have no idea"                     DISCHARGE CRITERIA:  Improved stabilization in mood, thinking, and/or behavior  PRELIMINARY DISCHARGE PLAN: Return to previous living arrangement  PATIENT/FAMILY INVOLVEMENT: This treatment plan has been presented to and reviewed with the patient, [name].  The patient has been given the opportunity to ask questions and make suggestions.  Gardiner Barefoot, RN 10/11/2023, 4:09 PM

## 2023-10-11 NOTE — Plan of Care (Signed)
  Problem: Education: Goal: Emotional status will improve Outcome: Not Progressing Goal: Mental status will improve Outcome: Not Progressing Goal: Verbalization of understanding the information provided will improve Outcome: Not Progressing   

## 2023-10-11 NOTE — Progress Notes (Signed)
Patient admitted from [facility] for paranoia, AH, and SI. At the time of admission patient denies SI/HI/AVH and paranoia. Patient minimal in interaction and not shook his head to answer questions. Skin search completed and redness noted on the right hand. BP 89/71 Pulse 110. Gatorade and food provided. Safety checks initiated. Patient remains safe at this time.

## 2023-10-11 NOTE — ED Notes (Addendum)
Pt lunch tray placed at bedside 

## 2023-10-11 NOTE — ED Notes (Signed)
Voluntary consent, transfer consent and rider waiver placed on paper chart.

## 2023-10-11 NOTE — ED Notes (Signed)
Breakfast tray provided to patient.

## 2023-10-11 NOTE — ED Provider Notes (Signed)
-----------------------------------------   2:34 PM on 10/11/2023 -----------------------------------------   Blood pressure 102/68, pulse 87, temperature 98.3 F (36.8 C), resp. rate 16, height 5\' 8"  (1.727 m), weight 77.1 kg, SpO2 98%.  The patient is calm and cooperative at this time.  There have been no acute events since the last update.  Patient to be admitted to [facility] behavioral health unit.  Taken by safe transport.   Janith Lima, MD 10/11/23 1435

## 2023-10-11 NOTE — BH Assessment (Addendum)
Patient has been accepted to [facility] on today 10/11/23, after 1:00pm. Patient assigned to room 506, bed# 2. Accepting physician is Dr. Enedina Finner.  Call report to [phone].  Representative was [employer].   ER Staff is aware of it:  Museum/gallery exhibitions officer  Dr. Anner Crete, ER MD  Connye Burkitt, Patient's Nurse     Patient's Family/Support System (Mom, April Lambs 336 [phone]) has been updated as well.

## 2023-10-11 NOTE — ED Provider Notes (Signed)
Emergency Medicine Observation Re-evaluation Note  Devon Thompson is a 32 y.o. male, seen on rounds today.  Pt initially presented to the ED for complaints of Suicidal Currently, the patient is resting, voices no medical complaints.  Physical Exam  BP 110/81 (BP Location: Left Arm)   Pulse 82   Temp 97.9 F (36.6 C) (Oral)   Resp 18   Ht 5\' 8"  (1.727 m)   Wt 77.1 kg   SpO2 99%   BMI 25.85 kg/m  Physical Exam General: Resting in no acute distress Cardiac: No cyanosis Lungs: Equal rise and fall Psych: Not agitated  ED Course / MDM  EKG:   I have reviewed the labs performed to date as well as medications administered while in observation.  Recent changes in the last 24 hours include no events overnight.  Plan  Current plan is for psychiatric disposition.    Irean Hong, MD 10/11/23 [phone]

## 2023-10-12 MED ORDER — NICOTINE 21 MG/24HR TD PT24
21.0000 mg | MEDICATED_PATCH | Freq: Every day | TRANSDERMAL | Status: DC
  Filled 2023-10-12 (×6): qty 1

## 2023-10-12 MED ORDER — OLANZAPINE 5 MG PO TBDP
5.0000 mg | ORAL_TABLET | Freq: Two times a day (BID) | ORAL | Status: DC
  Administered 2023-10-12 – 2023-10-14 (×5): 5 mg via ORAL
  Filled 2023-10-12 (×8): qty 1
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
## 2023-10-12 NOTE — Plan of Care (Signed)
  Problem: Education: Goal: Emotional status will improve Outcome: Progressing Goal: Mental status will improve Outcome: Progressing Goal: Verbalization of understanding the information provided will improve Outcome: Progressing  Patient isolative to his room complaint with medications encouraged to attend programming. Q 15 minutes safety checks ongoing. Support provided as needed.

## 2023-10-12 NOTE — Group Note (Signed)
Recreation Therapy Group Note   Group Topic:Self-Esteem  Group Date: 10/12/2023 Start Time: 1050 End Time: 1120 Facilitators: Trenese Haft-McCall, LRT,CTRS Location: [address] Dayroom   Goal Area(s) Addresses:  Patient will appropriately identify what self esteem is.  Patient will create a shield of armor describing themselves.  Patient will successfully identify positive attributes about themselves.  Patient will acknowledge benefit of improved self-esteem.    Group Description: Self-Esteem Shield. Patient attended a recreation therapy group session focused on self esteem. Patient identified what self esteem is, and why it is important to have high self esteem during group discussion. LRT wrote on the white board, drawing the outline of the shield and labeling the quadrants.  Patient was asked to create their own shield to show off their unique attributes, four quadrants reflected the following:  The Upper Left quadrant- 2 things or people they value The Upper Right quadrant- 2 lessons they have learned thus far in life The Lower Left quadrant- 3 qualities that make them unique The Lower Right quadrant- 1 goal they want to work towards   Patients were provided sheets with the shield printed on them and colored pencils, markers and crayons to complete the activity.  Patients and writer had group related discussions while individually working on their activity.  Patients were debriefed on the importance of healthy self esteem and offered a handout for ways to increase self esteem.   Education: Self esteem, Communication, Positive self-talk, Discharge Planning   Education Outcome: Acknowledges education/Verbalizes understanding of Education/In group clarification offered/Needs further education   Affect/Mood: N/A   Participation Level: Did not attend    Clinical Observations/Individualized Feedback:    Plan: Continue to engage patient in RT group sessions  2-3x/week.   Devon Thompson, LRT,CTRS  10/12/2023 12:07 PM

## 2023-10-12 NOTE — Progress Notes (Signed)
   10/12/23 0905  Psych Admission Type (Psych Patients Only)  Admission Status Voluntary  Psychosocial Assessment  Patient Complaints Irritability  Eye Contact Fair  Facial Expression Flat  Affect Anxious  Speech Soft;Slow  Interaction Guarded;Minimal  Motor Activity Slow  Appearance/Hygiene Unremarkable  Behavior Characteristics Cooperative;Anxious  Mood Irritable  Thought Process  Coherency WDL  Content WDL  Delusions None reported or observed  Perception WDL  Hallucination None reported or observed  Judgment Impaired  Confusion None  Danger to Self  Current suicidal ideation? Denies  Agreement Not to Harm Self Yes  Description of Agreement Verbal  Danger to Others  Danger to Others None reported or observed

## 2023-10-12 NOTE — Progress Notes (Signed)
   10/11/23 2113  Psych Admission Type (Psych Patients Only)  Admission Status Voluntary  Psychosocial Assessment  Patient Complaints Agitation  Eye Contact Fair  Facial Expression Flat;Anxious  Affect Anxious  Speech Logical/coherent  Interaction Guarded  Motor Activity Slow  Appearance/Hygiene Unremarkable  Behavior Characteristics Cooperative;Appropriate to situation  Mood Irritable  Thought Process  Coherency WDL  Content WDL  Delusions None reported or observed  Perception WDL  Hallucination None reported or observed  Judgment Impaired  Confusion WDL  Danger to Self  Current suicidal ideation? Denies  Danger to Others  Danger to Others None reported or observed

## 2023-10-12 NOTE — BHH Suicide Risk Assessment (Signed)
Suicide Risk Assessment  Admission Assessment    [facility] Admission Suicide Risk Assessment   Nursing information obtained from:    Demographic factors:  Male, Caucasian Current Mental Status:  NA Loss Factors:  NA Historical Factors:  NA Risk Reduction Factors:  Sense of responsibility to family, Positive social support, Living with another person, especially a relative  Total Time spent with patient: 1.5 hours Principal Problem: Methamphetamine-induced psychotic disorder (HCC) Diagnosis:  Principal Problem:   Methamphetamine-induced psychotic disorder (HCC)   Subjective Data:   CC: "I need to get on the right meds"   Devon Thompson is a 32 y.o. male  with a documented past psychiatric history of polysubstance use (methamphetamines, ), with IV drug use, and prior drug rehabilitation hx.  He has no formal prior psychiatric hospitalizations, but has had multiple ED visits for suicidal behaviors and a prior attempt via hanging when he was in prison. Patient initially arrived to [facility] on 10/06/2023 for psychosis in the setting of recent stimulant use, and admitted to [facility] voluntarily on 10/11/2023 for severe substance-induced psychosis and mood disturbances.  He reports no significant past medical history.  Patient has a history of prior incarcerations and currently has a Engineer, drilling.  UDS positive for cocaine and amphetamines on admission.     HPI:    Patient was evaluated at bedside, observed laying in bed with eyes closed.  Patient reports he presented himself to [facility] for worsening paranoia and auditory and visual hallucinations.  In his words he reports "I am here to get on the right medicine".  He reports this is the first time he has experienced symptoms of psychosis.  He denies any drug use, despite UDS being positive for methamphetamines.  He reports he has been experiencing worsening depression, screening for this was limited due to patient's lethargy.   He denies any suicidal  ideations.  Denies homicidal ideations.  Contracts for safety on the unit.   Presently, he denied any auditory visual hallucinations.  He endorses lingering paranoia, but does not specify.  He denies delusional thought processes including thought insertion, thought withdrawal, and ideas of reference.     Psychiatric ROS Mood Symptoms Endorses unspecified symptoms of depression.  Limited due to patient's somnolence   Manic Symptoms Unable to formally assess   Anxiety Symptoms Unable to formally assess   Trauma Symptoms Unable to formally assess   Psychosis Symptoms As described in HPI.  Patient reports no prior history of psychosis.     Past Psychiatric Hx: Current Psychiatrist: Current Therapist: Previous Psychiatric Diagnoses:  Current psychiatric medications: denies any other Psychiatric medication history/compliance: Psychiatric Hospitalization hx: Psychotherapy hx: Neuromodulation history:  History of suicide (obtained from HPI): History of homicide or aggression (obtained in HPI):   Substance Abuse Hx: Alcohol: Unable to assess Tobacco: reports smoking cigarettes, 1 ppd for 10 years Cannabis: Unable to assess, UDS negative for THC see Other Illicit drugs: Has documented history of stimulant use including amphetamines, cocaine, and ice Rx drug abuse: Unable to assess Rehab hx: [facility]   Past Medical History: PCP: Unknown Medical Dx: None identified on chart review Medications: Identified on chart review Allergies: Anaphylactic reaction to amoxicillin and penicillin. Hospitalizations: Surgeries: None identified on chart review Trauma: None identified on chart review Seizures: denies     Family Medical History: Reports several family members on mother side have had MIs.   Family Psychiatric History: Psychiatric Dx: Unable to formally assess Suicide Hx: Unable  to formally assess Violence/Aggression: Unable to  formally assess Substance use: Unable to formally assess   Social History: Living Situation: Reports he lives in [place] with his girlfriend. Education: Completed 10 th grade Occupational hx: HVAC for three years Marital Status: Divorced Children: 6 children  Legal: Reports he has a Engineer, drilling, but is not aware that he is currently hospitalized.  He denies any upcoming court dates.  Per chart review patient was released from jail earlier this year in 2024 Military: no    Access to firearms: Denies  Continued Clinical Symptoms:  Alcohol Use Disorder Identification Test Final Score (AUDIT): 0 The "Alcohol Use Disorders Identification Test", Guidelines for Use in Primary Care, Second Edition.  World Science writer [facility]). Score between 0-7:  no or low risk or alcohol related problems. Score between 8-15:  moderate risk of alcohol related problems. Score between 16-19:  high risk of alcohol related problems. Score 20 or above:  warrants further diagnostic evaluation for alcohol dependence and treatment.   CLINICAL FACTORS:   Alcohol/Substance Abuse/Dependencies   Musculoskeletal: Strength & Muscle Tone: within normal limits Gait & Station: normal Patient leans: N/A   Psychiatric Specialty Exam:   Presentation  General Appearance:  Appropriate for Environment; Casual; Fairly Groomed   Eye Contact: Eyes closed   Speech: Clear and Coherent; Normal Rate   Speech Volume: Normal   Handedness: Not assessed     Mood and Affect  Mood: "Fine"   Affect: Flat, difficult to assess due to somnolence     Thought Process  Thought Processes: Coherent; Goal Directed; Linear   Descriptions of Associations: Intact   Orientation: Grossly intact   Thought Content: Logical; WDL   History of Schizophrenia/Schizoaffective disorder: No   Duration of Psychotic Symptoms:N/A Hallucinations:Hallucinations: None   Ideas of Reference: Paranoid ideations  present   Suicidal Thoughts:Suicidal Thoughts: No   Homicidal Thoughts:Homicidal Thoughts: No     Sensorium  Memory: Immediate Fair   Judgment: Fair   Insight: Fair     Executive Functions  Concentration: Good   Attention Span: Good   Recall: Good   Fund of Knowledge: Good   Language: Good     Psychomotor Activity  Psychomotor Activity:Psychomotor Activity: Normal     Assets  Assets: Communication Skills; Desire for Improvement; Resilience     Sleep  Sleep:Sleep: Good       Physical Exam: Physical Exam Vitals and nursing note reviewed.  Constitutional:      Comments: somnolent  HENT:     Head: Normocephalic and atraumatic.  Pulmonary:     Effort: Pulmonary effort is normal. No respiratory distress.  Skin:    General: Skin is warm and dry.  Psychiatric:        Behavior: Behavior is cooperative.      Review of Systems  All other systems reviewed and are negative.   Blood pressure 115/83, pulse (!) 124, temperature 97.7 F (36.5 C), temperature source Oral, resp. rate 16, height 5\' 5"  (1.651 m), weight 76.4 kg, SpO2 98%. Body mass index is 28.02 kg/m.   COGNITIVE FEATURES THAT CONTRIBUTE TO RISK:  None    SUICIDE RISK:   Moderate:  Frequent suicidal ideation with limited intensity, and duration, some specificity in terms of plans, no associated intent, good self-control, limited dysphoria/symptomatology, some risk factors present, and identifiable protective factors, including available and accessible social support.  PLAN OF CARE: See H&P for assessment and plan.   I certify that inpatient services furnished can reasonably  be expected to improve the patient's condition.   Lorri Frederick, MD 10/12/2023, 7:28 AM

## 2023-10-12 NOTE — H&P (Signed)
Psychiatric Admission Assessment Adult  Patient Identification: Devon Thompson MRN:  [id number] Date of Evaluation:  10/12/2023 Chief Complaint:  Methamphetamine-induced psychotic disorder [facility]) [F15.959] Principal Diagnosis: Methamphetamine-induced psychotic disorder (HCC) Diagnosis:  Principal Problem:   Methamphetamine-induced psychotic disorder (HCC)    CC: "I need to get on the right meds"  Devon Thompson is a 32 y.o. male  with a documented past psychiatric history of polysubstance use (methamphetamines, ), with IV drug use, and prior drug rehabilitation hx.  He has no formal prior psychiatric hospitalizations, but has had multiple ED visits for suicidal behaviors and a prior attempt via hanging when he was in prison. Patient initially arrived to [facility] on 10/06/2023 for psychosis in the setting of recent stimulant use, and admitted to [facility] voluntarily on 10/11/2023 for severe substance-induced psychosis and mood disturbances.  He reports no significant past medical history.  Patient has a history of prior incarcerations and currently has a Engineer, drilling.  UDS positive for cocaine and amphetamines on admission.   HPI:   Patient was evaluated at bedside, observed laying in bed with eyes closed.  Patient reports he presented himself to [facility] for worsening paranoia and auditory and visual hallucinations.  In his words he reports "I am here to get on the right medicine".  He reports this is the first time he has experienced symptoms of psychosis.  He denies any drug use, despite UDS being positive for methamphetamines.  He reports he has been experiencing worsening depression, screening for this was limited due to patient's lethargy.  He denies any suicidal ideations.  Denies homicidal ideations.  Contracts for safety on the unit.  Presently, he denied any auditory visual hallucinations.  He endorses lingering paranoia, but does not specify.  He denies delusional thought processes  including thought insertion, thought withdrawal, and ideas of reference.   Psychiatric ROS Mood Symptoms Endorses unspecified symptoms of depression.  Limited due to patient's somnolence  Manic Symptoms Unable to formally assess  Anxiety Symptoms Unable to formally assess  Trauma Symptoms Unable to formally assess  Psychosis Symptoms As described in HPI.  Patient reports no prior history of psychosis.   Past Psychiatric Hx: Current Psychiatrist: Current Therapist: Previous Psychiatric Diagnoses:  Current psychiatric medications: denies any other Psychiatric medication history/compliance: Psychiatric Hospitalization hx: Psychotherapy hx: Neuromodulation history:  History of suicide (obtained from HPI): History of homicide or aggression (obtained in HPI):  Substance Abuse Hx: Alcohol: Unable to assess Tobacco: reports smoking cigarettes, 1 ppd for 10 years Cannabis: Unable to assess, UDS negative for THC see Other Illicit drugs: Has documented history of stimulant use including amphetamines, cocaine, and ice Rx drug abuse: Unable to assess Rehab hx: [facility]  Past Medical History: PCP: Unknown Medical Dx: None identified on chart review Medications: Identified on chart review Allergies: Anaphylactic reaction to amoxicillin and penicillin. Hospitalizations: Surgeries: None identified on chart review Trauma: None identified on chart review Seizures: denies   Family Medical History: Reports several family members on mother side have had MIs.  Family Psychiatric History: Psychiatric Dx: Unable to formally assess Suicide Hx: Unable to formally assess Violence/Aggression: Unable to formally assess Substance use: Unable to formally assess  Social History: Living Situation: Reports he lives in [place] with his girlfriend. Education: Completed 10 th grade Occupational hx: HVAC for three years Marital Status:  Divorced Children: 6 children  Legal: Reports he has a Engineer, drilling, but is not aware that he is currently hospitalized.  He denies any upcoming court dates.  Per chart review patient was released from jail earlier this year in 2024 Military: no   Access to firearms: Denies   Total Time spent with patient: 1 hour   [place] Scale:  Flowsheet Row [facility] (Current) [facility] from 10[facility]10/2023 in [facility] Department at [facility] ED from 10/07/2023 in [facility] Emergency Department at [facility]  C-SSRS RISK CATEGORY No Risk High Risk No Risk        Tobacco Screening:  Social History   Tobacco Use  Smoking Status Every Day  Smokeless Tobacco Never    BH Tobacco Counseling     Are you interested in Tobacco Cessation Medications?  No, patient refused Counseled patient on smoking cessation:  Refused/Declined practical counseling Reason Tobacco Screening Not Completed: Patient Refused Screening       Social History:  Social History   Substance and Sexual Activity  Alcohol Use Not Currently     Social History   Substance and Sexual Activity  Drug Use Yes    Additional Social History:                           Allergies:   Allergies  Allergen Reactions   Penicillins Swelling and Other (See Comments)    Throat swells   Amoxicillin Swelling and Other (See Comments)    Tongue swells   Lab Results:  Results for orders placed or performed during the hospital encounter of 10/10/23 (from the past 48 hour(s))  CBC     Status: Abnormal   Collection Time: 10/10/23  3:55 PM  Result Value Ref Range   WBC 11.3 (H) 4.0 - 10.5 K/uL   RBC 4.78 4.22 - 5.81 MIL/uL   Hemoglobin 14.7 13.0 - 17.0 g/dL   HCT 78.4 69.6 - 29.5 %   MCV 89.5 80.0 - 100.0 fL   MCH 30.8 26.0 - 34.0 pg   MCHC 34.3 30.0 - 36.0 g/dL   RDW 28.4 13.2 - 44.0 %   Platelets 284 150 - 400 K/uL   nRBC 0.0 0.0  - 0.2 %    Comment: Performed at [facility], [address], [place], [place] [address]  Comprehensive metabolic panel     Status: Abnormal   Collection Time: 10/10/23  3:55 PM  Result Value Ref Range   Sodium 136 135 - 145 mmol/L   Potassium 3.9 3.5 - 5.1 mmol/L   Chloride 101 98 - 111 mmol/L   CO2 28 22 - 32 mmol/L   Glucose, Bld 92 70 - 99 mg/dL    Comment: Glucose reference range applies only to samples taken after fasting for at least 8 hours.   BUN 11 6 - 20 mg/dL   Creatinine, Ser 5.36 0.61 - 1.24 mg/dL   Calcium 8.7 (L) 8.9 - 10.3 mg/dL   Total Protein 7.2 6.5 - 8.1 g/dL   Albumin 3.9 3.5 - 5.0 g/dL   AST 20 15 - 41 U/L   ALT 25 0 - 44 U/L   Alkaline Phosphatase 63 38 - 126 U/L   Total Bilirubin 0.5 <1.2 mg/dL   GFR, Estimated >64 >40 mL/min    Comment: (NOTE) Calculated using the CKD-EPI Creatinine Equation (2021)    Anion gap 7 5 - 15    Comment: Performed at [facility], [address] [place], [place] [address]  Acetaminophen level  Status: Abnormal   Collection Time: 10/10/23  3:55 PM  Result Value Ref Range   Acetaminophen (Tylenol), Serum <10 (L) 10 - 30 ug/mL    Comment: (NOTE) Therapeutic concentrations vary significantly. A range of 10-30 ug/mL  may be an effective concentration for many patients. However, some  are best treated at concentrations outside of this range. Acetaminophen concentrations >150 ug/mL at 4 hours after ingestion  and >50 ug/mL at 12 hours after ingestion are often associated with  toxic reactions.  Performed at [facility], [address] [place], [place] [address]   Urine Drug Screen, Qualitative [facility] only)     Status: Abnormal   Collection Time: 10/11/23  7:23 AM  Result Value Ref Range   Tricyclic, Ur Screen NONE DETECTED NONE DETECTED   Amphetamines, Ur Screen POSITIVE (A) NONE DETECTED   MDMA (Ecstasy)Ur Screen NONE DETECTED NONE DETECTED   Cocaine Metabolite,Ur [place] POSITIVE (A) NONE  DETECTED   Opiate, Ur Screen NONE DETECTED NONE DETECTED   Phencyclidine (PCP) Ur S NONE DETECTED NONE DETECTED   Cannabinoid 50 Ng, Ur [place] NONE DETECTED NONE DETECTED   Barbiturates, Ur Screen NONE DETECTED NONE DETECTED   Benzodiazepine, Ur Scrn POSITIVE (A) NONE DETECTED   Methadone Scn, Ur NONE DETECTED NONE DETECTED    Comment: (NOTE) Tricyclics + metabolites, urine    Cutoff 1000 ng/mL Amphetamines + metabolites, urine  Cutoff 1000 ng/mL MDMA (Ecstasy), urine              Cutoff 500 ng/mL Cocaine Metabolite, urine          Cutoff 300 ng/mL Opiate + metabolites, urine        Cutoff 300 ng/mL Phencyclidine (PCP), urine         Cutoff 25 ng/mL Cannabinoid, urine                 Cutoff 50 ng/mL Barbiturates + metabolites, urine  Cutoff 200 ng/mL Benzodiazepine, urine              Cutoff 200 ng/mL Methadone, urine                   Cutoff 300 ng/mL  The urine drug screen provides only a preliminary, unconfirmed analytical test result and should not be used for non-medical purposes. Clinical consideration and professional judgment should be applied to any positive drug screen result due to possible interfering substances. A more specific alternate chemical method must be used in order to obtain a confirmed analytical result. Gas chromatography / mass spectrometry (GC/MS) is the preferred confirm atory method. Performed at [facility], [address] [place], [place] [address]     Blood Alcohol level:  Lab Results  Component Value Date   [facility] <10 10/07/2023   ETH <10 11/16/2021    Metabolic Disorder Labs:  No results found for: "HGBA1C", "MPG" No results found for: "PROLACTIN" No results found for: "CHOL", "TRIG", "HDL", "CHOLHDL", "VLDL", "LDLCALC"  Current Medications: Current Facility-Administered Medications  Medication Dose Route Frequency Provider Last Rate Last Admin   acetaminophen (TYLENOL) tablet 650 mg  650 mg Oral Q6H PRN Charm Rings, NP        alum & mag hydroxide-simeth (MAALOX/MYLANTA) 200-200-20 MG/5ML suspension 30 mL  30 mL Oral Q4H PRN Charm Rings, NP       diphenhydrAMINE (BENADRYL) capsule 50 mg  50 mg Oral BID PRN Charm Rings, NP       Or   diphenhydrAMINE (BENADRYL)  injection 50 mg  50 mg Intravenous BID PRN Charm Rings, NP       FLUoxetine (PROZAC) capsule 20 mg  20 mg Oral Daily Charm Rings, NP   20 mg at 10/12/23 5409   magnesium hydroxide (MILK OF MAGNESIA) suspension 30 mL  30 mL Oral Daily PRN Charm Rings, NP       nicotine (NICODERM CQ - dosed in mg/24 hours) patch 21 mg  21 mg Transdermal Daily Carrion-Carrero, Jaedynn Bohlken, MD       OLANZapine (ZYPREXA) tablet 10 mg  10 mg Oral BID PRN Charm Rings, NP       Or   OLANZapine (ZYPREXA) injection 10 mg  10 mg Intramuscular BID PRN Charm Rings, NP       OLANZapine zydis (ZYPREXA) disintegrating tablet 5 mg  5 mg Oral Q12H Carrion-Carrero, Qiana Landgrebe, MD       PTA Medications: No medications prior to admission.    Musculoskeletal: Strength & Muscle Tone: within normal limits Gait & Station: normal Patient leans: N/A  Psychiatric Specialty Exam:  Presentation  General Appearance:  Appropriate for Environment; Casual; Fairly Groomed  Eye Contact: Eyes closed  Speech: Clear and Coherent; Normal Rate  Speech Volume: Normal  Handedness: Not assessed   Mood and Affect  Mood: "Fine"  Affect: Flat, difficult to assess due to somnolence   Thought Process  Thought Processes: Coherent; Goal Directed; Linear  Descriptions of Associations: Intact  Orientation: Grossly intact  Thought Content: Logical; WDL  History of Schizophrenia/Schizoaffective disorder: No  Duration of Psychotic Symptoms:N/A Hallucinations:Hallucinations: None  Ideas of Reference: Paranoid ideations present  Suicidal Thoughts:Suicidal Thoughts: No  Homicidal Thoughts:Homicidal Thoughts: No   Sensorium  Memory: Immediate  Fair  Judgment: Fair  Insight: Fair   Executive Functions  Concentration: Good  Attention Span: Good  Recall: Good  Fund of Knowledge: Good  Language: Good   Psychomotor Activity  Psychomotor Activity:Psychomotor Activity: Normal   Assets  Assets: Communication Skills; Desire for Improvement; Resilience   Sleep  Sleep:Sleep: Good    Physical Exam: Physical Exam Vitals and nursing note reviewed.  Constitutional:      Comments: somnolent  HENT:     Head: Normocephalic and atraumatic.  Pulmonary:     Effort: Pulmonary effort is normal. No respiratory distress.  Skin:    General: Skin is warm and dry.  Psychiatric:        Behavior: Behavior is cooperative.    Review of Systems  All other systems reviewed and are negative.  Blood pressure 115/83, pulse (!) 124, temperature 97.7 F (36.5 C), temperature source Oral, resp. rate 16, height 5\' 5"  (1.651 m), weight 76.4 kg, SpO2 98%. Body mass index is 28.02 kg/m.   Treatment Plan Summary: Daily contact with patient to assess and evaluate symptoms and progress in treatment and Medication management   ASSESSMENT: Devon Thompson is a 32 y.o. male  with a documented past psychiatric history of polysubstance use (methamphetamines, ), with IV drug use, and prior drug rehabilitation hx.  He has no formal prior psychiatric hospitalizations, but has had multiple ED visits for suicidal behaviors and a prior attempt via hanging when he was in prison. Patient initially arrived to [facility] on 10/06/2023 for psychosis in the setting of recent stimulant use, and admitted to [facility] voluntarily on 10/11/2023 for severe substance-induced psychosis and mood disturbances.  He reports no significant past medical history.  Patient has a history of prior incarcerations and currently has a  probation Technical sales engineer.  UDS positive for cocaine and amphetamines on admission.   Diagnoses / Active Problems: Stimulant induced psychosis Stimulant  use disorder Tobacco use disorder   PLAN: Safety and Monitoring:  --  VOLUNTARY  admission to inpatient psychiatric unit for safety, stabilization and treatment  -- Daily contact with patient to assess and evaluate symptoms and progress in treatment  -- Patient's case to be discussed in multi-disciplinary team meeting  -- Observation Level : q15 minute checks  -- Vital signs:  q12 hours  -- Precautions: suicide, elopement, and assault  2. Psychiatric Diagnoses and Treatment:  Discontinue Risperdal Start Zyprexa Zydis 5 mg every 12 hours for psychosis Continue Prozac 20 mg daily for depressive symptoms -- The risks/benefits/side-effects/alternatives to this medication were discussed in detail with the patient and time was given for questions. The patient consents to medication trial.              -- Metabolic profile and EKG monitoring obtained while on an atypical antipsychotic  BMI: 28.02 kg/m TSH: Pending Lipid Panel: Pending HbgA1c: Pending EKG on 10/07/2023: QTc 426             -- Encouraged patient to participate in unit milieu and in scheduled group therapies   -- Short Term Goals: Ability to identify changes in lifestyle to reduce recurrence of condition will improve and Ability to verbalize feelings will improve  -- Long Term Goals: Improvement in symptoms so as ready for discharge Other PRNS:  Tylenol 650 mg every 6 hours as needed Maalox Mylanta every 4 hours as needed Magnesium hydroxide suspension 30 mL daily as needed Agitation protocol (Benadryl, Zyprexa)  Other labs reviewed on admission:  UDS +Amphetamine, +Cocaine, +Benzodiazepine CMP Ca 8.8, otherwise unremarkable EtOH negative Salicylate and Acetaminophen both negative for toxicity CBC showing leukocytosis (WBC 11.3)    3. Medical Issues Being Addressed:   #Tobacco Use Disorder  Nicotine patch 21mg /24 hours ordered  Smoking cessation encouraged  Pending hepatitis panel due to documented history of IV  drug use.  4. Discharge Planning:   -- Social work and case management to assist with discharge planning and identification of hospital follow-up needs prior to discharge  -- Estimated Discharge Date: TBD  -- Discharge Concerns: Need to establish a safety plan; Medication compliance and effectiveness  -- Discharge Goals: Return home with outpatient referrals for mental health follow-up including medication management/psychotherapy   I certify that inpatient services furnished can reasonably be expected to improve the patient's condition.   This note was created using a voice recognition software as a result there may be grammatical errors inadvertently enclosed that do not reflect the nature of this encounter. Every attempt is made to correct such errors.   Signed: Dr. Liston Alba, MD PGY-2, Psychiatry Residency  11/19/202411:24 AM

## 2023-10-12 NOTE — Plan of Care (Signed)
  Problem: Education: Goal: Emotional status will improve Outcome: Progressing Goal: Mental status will improve Outcome: Progressing   Problem: Activity: Goal: Sleeping patterns will improve Outcome: Progressing   

## 2023-10-13 ENCOUNTER — Encounter (HOSPITAL_COMMUNITY): Payer: Self-pay

## 2023-10-13 NOTE — Group Note (Signed)
Recreation Therapy Group Note   Group Topic:Stress Management  Group Date: 10/13/2023 Start Time: 1035 End Time: 1055 Facilitators: Chester Sibert-McCall, LRT,CTRS Location: 500 Hall Dayroom   Group Topic: Stress Management  Goal Area(s) Addresses:  Patient will identify positive stress management techniques. Patient will identify benefits of using stress management post d/c.  Group Description: Meditation. LRT and patients discussed meditation and how it can be effective. LRT played a meditation that focused on finding your true self. LRT informed patients they could find meditations and other stress management techniques through Youtube, apps, internet, etc.   Education:  Stress Management, Discharge Planning.   Education Outcome: Acknowledges Education   Affect/Mood: N/A   Participation Level: Did not attend    Clinical Observations/Individualized Feedback:      Plan: Continue to engage patient in RT group sessions 2-3x/week.   Iyanna Drummer-McCall, LRT,CTRS  10/13/2023 12:47 PM

## 2023-10-13 NOTE — Progress Notes (Signed)
   10/13/23 0809  Psych Admission Type (Psych Patients Only)  Admission Status Voluntary  Psychosocial Assessment  Patient Complaints Anxiety  Eye Contact Fair  Facial Expression Flat  Affect Anxious  Speech Soft;Slow  Interaction Guarded;Minimal  Motor Activity Slow  Appearance/Hygiene Unremarkable  Behavior Characteristics Cooperative;Anxious  Mood Anxious  Thought Process  Coherency WDL  Content WDL  Delusions None reported or observed  Perception WDL  Hallucination None reported or observed  Judgment Impaired  Confusion None  Danger to Self  Current suicidal ideation? Denies  Agreement Not to Harm Self Yes  Description of Agreement Verbal  Danger to Others  Danger to Others None reported or observed

## 2023-10-13 NOTE — Progress Notes (Signed)
Patient ID: Devon Thompson, male   DOB: [birth year], 32 y.o.   MRN: [id number] [facility] MD Progress Note  10/13/2023 9:44 AM WILBERN THAI  MRN:  [id number]  Principal Problem: Methamphetamine-induced psychotic disorder [facility]) Diagnosis: Principal Problem:   Methamphetamine-induced psychotic disorder [facility])   Reason for Admission:  Devon Thompson is a 32 y.o. male  with a past psychiatric history of polysubstance use (methamphetamines) with IV drug use, and prior drug rehabilitation hx . Patient initially arrived to [facility] on 10/06/2023 for psychosis in the setting of recent stimulant use, and admitted to [facility] voluntarily on 10/11/2023 for severe substance-induced psychosis and mood disturbances. Patient has a history of prior incarcerations and currently has a Engineer, drilling. UDS positive for cocaine and amphetamines on admission.  (admitted on 10/11/2023, total  LOS: 2 days )      Pertinent information discussed during bed progression: Mostly in bed sleeping. Slept throughout the night 10.75 hours.    PRNs required overnight: none    Information Obtained Today During Patient Interview:  Patient evaluated at bedside. States mood is "okay" today. Feels depressed, hopeless, guilty and worthless. He has been sleeping more than usual and has increased appetite. But he has not eaten much and has not touched his breakfast. Reports he has felt this before but while he was using other substances. Reports of using Suboxone through a clinic and recently buying them. Last use was 2 days ago snorted through nose and was using 0.5 mg. Denies ever injecting drugs in the past. UDS is negative for opiates and positive for cocaine and aphetamines.   Per chart review, pt was consented to speak with girlfriend but 2 unsuccessful attempt made to contact. Patient has a Engineer, drilling and needs to notify them; "only thing he cares about right now." Wants to go back to work. Epic chat sent to Child psychotherapist.  Provided short answers to questions and went back to lay in bed mid interview. Per Dr. Enedina Finner, he is willing to go to rehab therapy.   On interview, suicidal ideations are not present . Homicidal ideations are not present.   There are no auditory hallucinations, visual hallucinations, paranoid ideations, or delusional thought processes.   Currently prescribed medications are Prozac 20mg , nicotine patch 21mg  and olanzapine 5 mg BID.   Past Psychiatric History: BPD at age 28 yo, but never treated. Substance Use disorder. Denies seeing any psychiatrist or therapist in the past. Per chart review, has been to ED frequently due to [facility], VH and paranoia. Denies previous suicide attempt but per chart review, he has history of suicide attempt in jail in 2022.  Family Medical History: Reports several family members on mother side have had MIs. Family Psychiatric History: Denied any family psych history.  Social History: Living Situation: Reports he lives in [place] with his girlfriend. Education: Completed 10 th grade Occupational hx: HVAC for three years Marital Status: Divorced Children: 6 children  Legal: Reports he has a Engineer, drilling, but is not aware that he is currently hospitalized.  He denies any upcoming court dates.  Per chart review patient was released from jail earlier this year in 2024 Military: no    Access to firearms: Denies Past Medical History:  Past Medical History:  Diagnosis Date   Bipolar 1 disorder (HCC)    HTN (hypertension)    Schizophrenia (HCC)    Family History: History reviewed. No pertinent family history.  Current Medications: Current Facility-Administered Medications  Medication Dose Route Frequency Provider  Last Rate Last Admin   acetaminophen (TYLENOL) tablet 650 mg  650 mg Oral Q6H PRN Charm Rings, NP       alum & mag hydroxide-simeth (MAALOX/MYLANTA) 200-200-20 MG/5ML suspension 30 mL  30 mL Oral Q4H PRN Charm Rings, NP        diphenhydrAMINE (BENADRYL) capsule 50 mg  50 mg Oral BID PRN Charm Rings, NP       Or   diphenhydrAMINE (BENADRYL) injection 50 mg  50 mg Intravenous BID PRN Charm Rings, NP       FLUoxetine (PROZAC) capsule 20 mg  20 mg Oral Daily Charm Rings, NP   20 mg at 10/12/23 0102   magnesium hydroxide (MILK OF MAGNESIA) suspension 30 mL  30 mL Oral Daily PRN Charm Rings, NP       nicotine (NICODERM CQ - dosed in mg/24 hours) patch 21 mg  21 mg Transdermal Daily Carrion-Carrero, Margely, MD       OLANZapine (ZYPREXA) tablet 10 mg  10 mg Oral BID PRN Charm Rings, NP       Or   OLANZapine (ZYPREXA) injection 10 mg  10 mg Intramuscular BID PRN Charm Rings, NP       OLANZapine zydis (ZYPREXA) disintegrating tablet 5 mg  5 mg Oral Q12H Carrion-Carrero, Margely, MD   5 mg at 10/12/23 2234    Lab Results: No results found for this or any previous visit (from the past 48 hour(s)).  Blood Alcohol level:  Lab Results  Component Value Date   ETH <10 10/07/2023   ETH <10 11/16/2021    Metabolic Labs: No results found for: "HGBA1C", "MPG" No results found for: "PROLACTIN" No results found for: "CHOL", "TRIG", "HDL", "CHOLHDL", "VLDL", "LDLCALC"  Physical Findings: AIMS: No  CIWA:    COWS:     Psychiatric Specialty Exam:  Presentation  General Appearance: Appropriate for Environment; Casual; Fairly Groomed  Eye Contact:Minimal; went back to lay in bed mid interview.   Speech:Clear and Coherent; Normal Rate  Speech Volume:Normal    Mood and Affect  Mood:"I'm doing fine."  Affect: Incongruent; Irritable    Thought Process  Thought Processes:Coherent; Goal Directed; Linear  Descriptions of Associations:Intact  Orientation:Grossly intact   Thought Content:Logical; WDL  History of Schizophrenia/Schizoaffective disorder:No  Duration of Psychotic Symptoms:N/A  Hallucinations:Hallucinations: None  Ideas of Reference:Not apparent  Suicidal Thoughts:Suicidal  Thoughts: No  Homicidal Thoughts:Homicidal Thoughts: No   Sensorium  Memory:Immediate Fair  Judgment:Fair  Insight: Fair   Art therapist  Concentration:Poor  Attention Span:Limited  Recall:Unable to assess due to limited participation.   Fund of Knowledge:Unable to assess.  Language:Good   Psychomotor Activity  Psychomotor Activity:Psychomotor Activity: Normal   Assets  Assets:Communication Skills; Desire for Improvement; Resilience   Sleep  Sleep:Good    Physical Exam: Physical Exam Constitutional:      Appearance: Normal appearance.  Eyes:     Conjunctiva/sclera: Conjunctivae normal.     Pupils: Pupils are equal, round, and reactive to light.  Neurological:     Mental Status: He is oriented to person, place, and time.  Psychiatric:        Attention and Perception: Attention and perception normal.        Mood and Affect: Mood normal. Affect is flat.        Speech: Speech normal.        Behavior: Behavior is slowed and withdrawn. Behavior is cooperative.        Thought  Content: Thought content normal.        Cognition and Memory: Cognition and memory normal.        Judgment: Judgment normal.     Comments: Slightly anxious    ROS Blood pressure 115/83, pulse (!) 124, temperature 97.7 F (36.5 C), temperature source Oral, resp. rate 16, height 5\' 5"  (1.651 m), weight 76.4 kg, SpO2 98%. Body mass index is 28.02 kg/m.  Treatment Plan Summary: Daily contact with patient to assess and evaluate symptoms and progress in treatment   ASSESSMENT: JAVARIUS SLAVEY is a 32 y.o. male  with a past psychiatric history of polysubstance use (methamphetamines) with IV drug use, and prior drug rehabilitation hx . Patient initially arrived to [facility] on 10/06/2023 for psychosis in the setting of recent stimulant use, and admitted to [facility] voluntarily on 10/11/2023 for severe substance-induced psychosis and mood disturbances.   Patient is somnolent, tachycardic, and  slightly anxious about getting in touch with probation officer. Olanzapine will be tritiated.    Diagnoses / Active Problems: Stimulant induced psychosis Rule out withdrawal induced mood disturbance Stimulant use disorder Tobacco use disorder  PLAN: Safety and Monitoring:  --  VOLUNTARY admission to inpatient psychiatric unit for safety, stabilization and treatment  -- Daily contact with patient to assess and evaluate symptoms and progress in treatment  -- Patient's case to be discussed in multi-disciplinary team meeting  -- Observation Level : q15 minute checks  -- Vital signs:  q12 hours  -- Precautions: suicide, elopement, and assault  2. Psychiatric Diagnoses and Treatment:  Discontinued Risperdal on admission  Titrate Zyprexa Zydis to 5 mg once every evening from 5mg  BID Continue Prozac 20 mg daily for depressive symptoms -- The risks/benefits/side-effects/alternatives to this medication were discussed in detail with the patient and time was given for questions. The patient consents to medication trial.              -- Metabolic profile and EKG monitoring obtained while on an atypical antipsychotic Olanzapine BMI: 28.02 kg/m TSH:  Pending Lipid Panel: Pending HbgA1c: Pending  QTc: 300/426 ms and 109 bpm on 10/07/2023             -- Encouraged patient to participate in unit milieu and in Scheduled group therapies: Did not attend per chart review.  Improvement in symptoms so as ready for discharge Other PRNS: Agitation protocol (Benadryl, Haldol, Ativan)    3. Medical Issues Being Addressed:   #Tobacco Use Disorder  Nicotine patch 21mg /24 hours ordered Smoking cessation encouraged  4. Discharge Planning:   -- Social work and case management to assist with discharge planning and identification of hospital follow-up needs prior to discharge  -- Estimated Discharge Date: 5-7 days  -- Discharge Concerns: Need to establish a safety plan; Medication compliance and  effectiveness  -- Discharge Goals: Return home with outpatient referrals for mental health follow-up including medication management/psychotherapy   I certify that inpatient services furnished can reasonably be expected to improve the patient's condition.   This note was created using a voice recognition software as a result there may be grammatical errors inadvertently enclosed that do not reflect the nature of this encounter. Every attempt is made to correct such errors.   Deforest Hoyles  Medical Student  11/20/20249:44 AM  I personally was present and performed or re-performed the history, physical exam and medical decision-making activities of this service and have verified that the service and findings are accurately documented in the student's note.   Dr.  Liston Alba, MD PGY-2, Psychiatry Residency

## 2023-10-13 NOTE — BH IP Treatment Plan (Signed)
Interdisciplinary Treatment and Diagnostic Plan Update  10/13/2023 Time of Session: 11:25 Devon Thompson MRN: [id number]  Principal Diagnosis: Methamphetamine-induced psychotic disorder [facility])  Secondary Diagnoses: Principal Problem:   Methamphetamine-induced psychotic disorder (HCC)   Current Medications:  Current Facility-Administered Medications  Medication Dose Route Frequency Provider Last Rate Last Admin   acetaminophen (TYLENOL) tablet 650 mg  650 mg Oral Q6H PRN Charm Rings, NP       alum & mag hydroxide-simeth (MAALOX/MYLANTA) 200-200-20 MG/5ML suspension 30 mL  30 mL Oral Q4H PRN Charm Rings, NP       diphenhydrAMINE (BENADRYL) capsule 50 mg  50 mg Oral BID PRN Charm Rings, NP       Or   diphenhydrAMINE (BENADRYL) injection 50 mg  50 mg Intravenous BID PRN Charm Rings, NP       FLUoxetine (PROZAC) capsule 20 mg  20 mg Oral Daily Charm Rings, NP   20 mg at 10/13/23 1020   magnesium hydroxide (MILK OF MAGNESIA) suspension 30 mL  30 mL Oral Daily PRN Charm Rings, NP       nicotine (NICODERM CQ - dosed in mg/24 hours) patch 21 mg  21 mg Transdermal Daily Carrion-Carrero, Margely, MD       OLANZapine (ZYPREXA) tablet 10 mg  10 mg Oral BID PRN Charm Rings, NP       Or   OLANZapine (ZYPREXA) injection 10 mg  10 mg Intramuscular BID PRN Charm Rings, NP       OLANZapine zydis (ZYPREXA) disintegrating tablet 5 mg  5 mg Oral Q12H Carrion-Carrero, Margely, MD   5 mg at 10/13/23 1020   PTA Medications: No medications prior to admission.    Patient Stressors: Substance abuse    Patient Strengths: Supportive family/friends   Treatment Modalities: Medication Management, Group therapy, Case management,  1 to 1 session with clinician, Psychoeducation, Recreational therapy.   Physician Treatment Plan for Primary Diagnosis: Methamphetamine-induced psychotic disorder (HCC) Long Term Goal(s): Improvement in symptoms so as ready for discharge   Short  Term Goals: Ability to identify changes in lifestyle to reduce recurrence of condition will improve Ability to verbalize feelings will improve  Medication Management: Evaluate patient's response, side effects, and tolerance of medication regimen.  Therapeutic Interventions: 1 to 1 sessions, Unit Group sessions and Medication administration.  Evaluation of Outcomes: Not Progressing  Physician Treatment Plan for Secondary Diagnosis: Principal Problem:   Methamphetamine-induced psychotic disorder (HCC)  Long Term Goal(s): Improvement in symptoms so as ready for discharge   Short Term Goals: Ability to identify changes in lifestyle to reduce recurrence of condition will improve Ability to verbalize feelings will improve     Medication Management: Evaluate patient's response, side effects, and tolerance of medication regimen.  Therapeutic Interventions: 1 to 1 sessions, Unit Group sessions and Medication administration.  Evaluation of Outcomes: Not Progressing   RN Treatment Plan for Primary Diagnosis: Methamphetamine-induced psychotic disorder (HCC) Long Term Goal(s): Knowledge of disease and therapeutic regimen to maintain health will improve  Short Term Goals: Ability to remain free from injury will improve, Ability to verbalize frustration and anger appropriately will improve, Ability to demonstrate self-control, Ability to participate in decision making will improve, Ability to verbalize feelings will improve, Ability to disclose and discuss suicidal ideas, Ability to identify and develop effective coping behaviors will improve, and Compliance with prescribed medications will improve  Medication Management: RN will administer medications as ordered by provider, will assess and evaluate patient's  response and provide education to patient for prescribed medication. RN will report any adverse and/or side effects to prescribing provider.  Therapeutic Interventions: 1 on 1 counseling  sessions, Psychoeducation, Medication administration, Evaluate responses to treatment, Monitor vital signs and CBGs as ordered, Perform/monitor CIWA, COWS, AIMS and Fall Risk screenings as ordered, Perform wound care treatments as ordered.  Evaluation of Outcomes: Not Progressing   LCSW Treatment Plan for Primary Diagnosis: Methamphetamine-induced psychotic disorder (HCC) Long Term Goal(s): Safe transition to appropriate next level of care at discharge, Engage patient in therapeutic group addressing interpersonal concerns.  Short Term Goals: Engage patient in aftercare planning with referrals and resources, Increase social support, Increase ability to appropriately verbalize feelings, Increase emotional regulation, Facilitate acceptance of mental health diagnosis and concerns, Facilitate patient progression through stages of change regarding substance use diagnoses and concerns, Identify triggers associated with mental health/substance abuse issues, and Increase skills for wellness and recovery  Therapeutic Interventions: Assess for all discharge needs, 1 to 1 time with Social worker, Explore available resources and support systems, Assess for adequacy in community support network, Educate family and significant other(s) on suicide prevention, Complete Psychosocial Assessment, Interpersonal group therapy.  Evaluation of Outcomes: Not Progressing   Progress in Treatment: Attending groups: No. Participating in groups: No. Taking medication as prescribed: Yes. Toleration medication: Yes. Family/Significant other contact made: No, will contact:  consent pending Patient understands diagnosis: Yes. Discussing patient identified problems/goals with staff: Yes. Medical problems stabilized or resolved: Yes. Denies suicidal/homicidal ideation: No. Issues/concerns per patient self-inventory: No.   New problem(s) identified: No, Describe:  none reported  New Short Term/Long Term Goal(s): detox,  medication management for mood stabilization; elimination of SI thoughts; development of comprehensive mental wellness/sobriety plan    Patient Goals:  "to discharge and maybe go to rehab."  Discharge Plan or Barriers: Patient recently admitted. CSW will continue to follow and assess for appropriate referrals and possible discharge planning.    Reason for Continuation of Hospitalization: Withdrawal symptoms  Estimated Length of Stay: 5-7 days  Last 3 [place] Suicide Severity Risk Score: Flowsheet Row [facility] (Current[facility] from 10/10/2023 in [facility] ED from 10/07/2023 in [facility] Emergency Department at [facility]  C-SSRS RISK CATEGORY No Risk High Risk No Risk       Last PHQ 2/9 Scores:     No data to display          Scribe for Treatment Team: Marinda Elk, Alexander Mt 10/13/2023 2:47 PM

## 2023-10-13 NOTE — Plan of Care (Signed)
°  Problem: Education: °Goal: Emotional status will improve °Outcome: Progressing °Goal: Mental status will improve °Outcome: Progressing °Goal: Verbalization of understanding the information provided will improve °Outcome: Progressing °  °

## 2023-10-13 NOTE — Progress Notes (Signed)
CSW received verbal consent from pt to speak with girlfriend, Danny Lawless [phone], 2 attempts have made to contact. CSW will continue to contact.

## 2023-10-14 MED ORDER — OLANZAPINE 5 MG PO TBDP
5.0000 mg | ORAL_TABLET | Freq: Every day | ORAL | Status: DC
  Administered 2023-10-15: 5 mg via ORAL
  Filled 2023-10-14: qty 1
  Filled 2023-10-14: qty 7
  Filled 2023-10-14: qty 1

## 2023-10-14 NOTE — BHH Group Notes (Signed)
BHH Group Notes:  (Nursing/MHT/Case Management/Adjunct)  Date:  10/14/2023  Time:  8:44 PM  Type of Therapy:  Psychoeducational Skills  Participation Level:  Did Not Attend  Participation Quality:  Resistant  Affect:  Resistant  Cognitive:  Lacking  Insight:  None  Engagement in Group:  None  Modes of Intervention:  Education  Summary of Progress/Problems: The patient did not attend group this evening.   Hazle Coca S 10/14/2023, 8:44 PM

## 2023-10-14 NOTE — Plan of Care (Signed)
  Problem: Activity: Goal: Interest or engagement in activities will improve Outcome: Progressing   Problem: Coping: Goal: Ability to verbalize frustrations and anger appropriately will improve Outcome: Progressing   Problem: Safety: Goal: Periods of time without injury will increase Outcome: Progressing   

## 2023-10-14 NOTE — Plan of Care (Signed)
  Problem: Education: Goal: Emotional status will improve Outcome: Progressing Goal: Mental status will improve Outcome: Progressing  Colbert has been isolative to his room this shift compliant with medication. Encouragement provided to attend on unit programming. Patient in bed sleeping respirations noted no S/S of distress.

## 2023-10-14 NOTE — Progress Notes (Signed)
Pt signed consent for CSW to speak with his mother, April Lamb [phone] who reported pt has been in a volatile relationship with his current girlfriend. Mother reported concern about pt's drug usage. Mother reported she is concerned about pt overdosing. Mother reported more than likely pt will return to his girlfriend's home, Danny Lawless [phone] after discharge.

## 2023-10-14 NOTE — Group Note (Unsigned)
Date:  10/14/2023 Time:  11:38 AM  Group Topic/Focus:  Goals Group:   The focus of this group is to help patients establish daily goals to achieve during treatment and discuss how the patient can incorporate goal setting into their daily lives to aide in recovery. Orientation:   The focus of this group is to educate the patient on the purpose and policies of crisis stabilization and provide a format to answer questions about their admission.  The group details unit policies and expectations of patients while admitted.     Participation Level:  {BHH PARTICIPATION OZHYQ:65784}  Participation Quality:  {BHH PARTICIPATION QUALITY:22265}  Affect:  {BHH AFFECT:22266}  Cognitive:  {BHH COGNITIVE:22267}  Insight: {BHH Insight2:20797}  Engagement in Group:  {BHH ENGAGEMENT IN ONGEX:52841}  Modes of Intervention:  {BHH MODES OF INTERVENTION:22269}  Additional Comments:  ***  Laconda Basich T Jaaliyah Lucatero 10/14/2023, 11:38 AM

## 2023-10-14 NOTE — Progress Notes (Signed)
Pt currently on probation, P.O. Officer Crossland [phone]. Pt signed consent for CSW to speak with officer.  Officer Crossland reported pt does not have a court date and requested to be kept up to date as to when pt will be discharged.

## 2023-10-14 NOTE — Progress Notes (Signed)
   10/14/23 0800  Psych Admission Type (Psych Patients Only)  Admission Status Voluntary  Psychosocial Assessment  Patient Complaints Anxiety  Eye Contact Fair;Brief  Facial Expression Flat  Affect Anxious  Speech Soft  Interaction Minimal  Motor Activity Slow  Appearance/Hygiene Unremarkable  Behavior Characteristics Unwilling to participate  Mood Anxious;Depressed  Thought Process  Coherency WDL  Content WDL  Delusions None reported or observed  Perception WDL  Hallucination None reported or observed  Judgment WDL  Confusion None  Danger to Self  Current suicidal ideation? Denies  Agreement Not to Harm Self Yes  Description of Agreement Verbal  Danger to Others  Danger to Others None reported or observed

## 2023-10-14 NOTE — BHH Suicide Risk Assessment (Signed)
B[facility] INPATIENT:  Family/Significant Other Suicide Prevention Education  Suicide Prevention Education:  Education Completed; Devon Thompson,girlfriend [phone] (name of family member/significant other) has been identified by the patient as the family member/significant other with whom the patient will be residing, and identified as the person(s) who will aid the patient in the event of a mental health crisis (suicidal ideations/suicide attempt).  With written consent from the patient, the family member/significant other has been provided the following suicide prevention education, prior to the and/or following the discharge of the patient.  CSW received consent to speak with girlfriend who reported no safety concerns and pt can return home. Devon reported she has removed guns from her home and gave them to her friends who lives in another county. She will also  secure knives, sharps and all medications. She will also administer pt's medications.   The suicide prevention education provided includes the following: Suicide risk factors Suicide prevention and interventions National Suicide Hotline telephone number [facility] assessment telephone number [facility] Emergency Assistance 911 [facility] and/or Residential Mobile Crisis Unit telephone number  Request made of family/significant other to: Remove weapons (e.g., guns, rifles, knives), all items previously/currently identified as safety concern.   Remove drugs/medications (over-the-counter, prescriptions, illicit drugs), all items previously/currently identified as a safety concern.  The family member/significant other verbalizes understanding of the suicide prevention education information provided.  The family member/significant other agrees to remove the items of safety concern listed above.  Devon Thompson 10/14/2023, 3:28 PM

## 2023-10-14 NOTE — Progress Notes (Signed)
Patient ID: Devon Thompson, male   DOB: 1991/05/27, 32 y.o.   MRN: [id number] [facility] MD Progress Note  10/14/2023 10:43 AM Devon Thompson  MRN:  [id number]  Principal Problem: Methamphetamine-induced psychotic disorder [facility]) Diagnosis: Principal Problem:   Methamphetamine-induced psychotic disorder [facility])   Reason for Admission:  Devon Thompson is a 32 y.o. male  with a past psychiatric history of polysubstance use (methamphetamines) with IV drug use, and prior drug rehabilitation hx . Patient initially arrived to [facility] on 10/06/2023 for psychosis in the setting of recent stimulant use, and admitted to [facility] voluntarily on 10/11/2023 for severe substance-induced psychosis and mood disturbances. Patient has a history of prior incarcerations and currently has a Engineer, drilling. UDS positive for cocaine and amphetamines on admission.  (admitted on 10/11/2023, total  LOS: 2 days )     Pertinent information discussed during bed progression: No acute overnight events.  Patient participated in unit milieu, remains in his room and engaging minimally with staff.   PRNs required overnight: None   Information Obtained Today During Patient Interview:  Patient irritable, laying in bed.  Answers minimally to questions.  Presents irritable.  When encouraged to participate in unit milieu, and work on increasing his physical activity, he states "I do not care, I am not going out there, you can keep me here as long as you want".  He was able to sit up on his bed for a physical exam and promptly laid back down.  He denied suicidal ideations.  Homicidal ideations not present.  No auditory visual hallucinations.  Denies paranoid ideations or delusional thought processes.  Reports onset to current symptoms.  Somatic complaints.  Past Psychiatric History: BPD at age 46 yo, but never treated. Substance Use disorder. Denies seeing any psychiatrist or therapist in the past. Per chart review, has been to ED  frequently due to [facility], VH and paranoia. Denies previous suicide attempt but per chart review, he has history of suicide attempt in jail in 2022.  Family Medical History: Reports several family members on mother side have had MIs. Family Psychiatric History: Denied any family psych history.  Social History: Living Situation: Reports he lives in [place] with his girlfriend. Education: Completed 10 th grade Occupational hx: HVAC for three years Marital Status: Divorced Children: 6 children  Legal: Reports he has a Engineer, drilling, but is not aware that he is currently hospitalized.  He denies any upcoming court dates.  Per chart review patient was released from jail earlier this year in 2024 Military: no    Access to firearms: Denies Past Medical History:  Past Medical History:  Diagnosis Date   Bipolar 1 disorder (HCC)    HTN (hypertension)    Schizophrenia (HCC)    Family History: History reviewed. No pertinent family history.  Current Medications: Current Facility-Administered Medications  Medication Dose Route Frequency Provider Last Rate Last Admin   acetaminophen (TYLENOL) tablet 650 mg  650 mg Oral Q6H PRN Charm Rings, NP       alum & mag hydroxide-simeth (MAALOX/MYLANTA) 200-200-20 MG/5ML suspension 30 mL  30 mL Oral Q4H PRN Charm Rings, NP       diphenhydrAMINE (BENADRYL) capsule 50 mg  50 mg Oral BID PRN Charm Rings, NP       Or   diphenhydrAMINE (BENADRYL) injection 50 mg  50 mg Intravenous BID PRN Charm Rings, NP       FLUoxetine (PROZAC) capsule 20 mg  20 mg Oral  Daily Charm Rings, NP   20 mg at 10/14/23 0830   magnesium hydroxide (MILK OF MAGNESIA) suspension 30 mL  30 mL Oral Daily PRN Charm Rings, NP       nicotine (NICODERM CQ - dosed in mg/24 hours) patch 21 mg  21 mg Transdermal Daily Carrion-Carrero, Nadiyah Zeis, MD       OLANZapine (ZYPREXA) tablet 10 mg  10 mg Oral BID PRN Charm Rings, NP       Or   OLANZapine (ZYPREXA) injection  10 mg  10 mg Intramuscular BID PRN Charm Rings, NP       Melene Muller ON 10/15/2023] OLANZapine zydis (ZYPREXA) disintegrating tablet 5 mg  5 mg Oral QHS Carrion-Carrero, Charlane Westry, MD        Lab Results: No results found for this or any previous visit (from the past 48 hour(s)).  Blood Alcohol level:  Lab Results  Component Value Date   ETH <10 10/07/2023   ETH <10 11/16/2021    Metabolic Labs: No results found for: "HGBA1C", "MPG" No results found for: "PROLACTIN" No results found for: "CHOL", "TRIG", "HDL", "CHOLHDL", "VLDL", "LDLCALC"  Physical Findings: AIMS: No  CIWA:    COWS:     Psychiatric Specialty Exam:  Presentation  General Appearance: Appropriate for Environment; Casual; Fairly Groomed  Eye Contact:Minimal; went back to lay in bed mid interview.   Speech:Clear and Coherent; Normal Rate  Speech Volume:Normal    Mood and Affect  Mood:"I'm doing fine."  Affect: Incongruent; Irritable    Thought Process  Thought Processes:Coherent; Goal Directed; Linear  Descriptions of Associations:Intact  Orientation:Grossly intact   Thought Content:Logical; WDL  History of Schizophrenia/Schizoaffective disorder:No  Duration of Psychotic Symptoms:N/A  Hallucinations:No data recorded  Ideas of Reference:Not apparent  Suicidal Thoughts:No data recorded  Homicidal Thoughts:No data recorded   Sensorium  Memory:Immediate Fair  Judgment:Fair  Insight: Fair   Art therapist  Concentration:Poor  Attention Span:Limited  Recall:Unable to assess due to limited participation.   Fund of Knowledge:Unable to assess.  Language:Good   Psychomotor Activity  Psychomotor Activity:No data recorded   Assets  Assets:Communication Skills; Desire for Improvement; Resilience   Sleep  Sleep:Good    Physical Exam: Physical Exam Constitutional:      Appearance: Normal appearance.  Eyes:     Conjunctiva/sclera: Conjunctivae normal.     Pupils:  Pupils are equal, round, and reactive to light.  Neurological:     Mental Status: He is oriented to person, place, and time.  Psychiatric:        Attention and Perception: Attention and perception normal.        Mood and Affect: Mood normal. Affect is flat.        Speech: Speech normal.        Behavior: Behavior is slowed and withdrawn. Behavior is cooperative.        Thought Content: Thought content normal.        Cognition and Memory: Cognition and memory normal.        Judgment: Judgment normal.     Comments: Slightly anxious    ROS Blood pressure 117/69, pulse 89, temperature 97.7 F (36.5 C), temperature source Oral, resp. rate 16, height 5\' 5"  (1.651 m), weight 76.4 kg, SpO2 98%. Body mass index is 28.02 kg/m.  Treatment Plan Summary: Daily contact with patient to assess and evaluate symptoms and progress in treatment   ASSESSMENT: Devon Thompson is a 32 y.o. male  with a past psychiatric history of  polysubstance use (methamphetamines) with IV drug use, and prior drug rehabilitation hx . Patient initially arrived to [facility] on 10/06/2023 for psychosis in the setting of recent stimulant use, and admitted to [facility] voluntarily on 10/11/2023 for severe substance-induced psychosis and mood disturbances.   Patient is somnolent, tachycardic, and slightly anxious about getting in touch with probation officer. Olanzapine will be tritiated.    Diagnoses / Active Problems: Stimulant induced psychosis Rule out withdrawal induced mood disturbance Stimulant use disorder Tobacco use disorder  PLAN: Safety and Monitoring:  --  VOLUNTARY admission to inpatient psychiatric unit for safety, stabilization and treatment  -- Daily contact with patient to assess and evaluate symptoms and progress in treatment  -- Patient's case to be discussed in multi-disciplinary team meeting  -- Observation Level : q15 minute checks  -- Vital signs:  q12 hours  -- Precautions: suicide, elopement, and  assault  2. Psychiatric Diagnoses and Treatment:  Discontinued Risperdal on admission  Decrease Zyprexa Zydis to 5 mg once every evening from 5mg  BID Continue Prozac 20 mg daily for depressive symptoms -- The risks/benefits/side-effects/alternatives to this medication were discussed in detail with the patient and time was given for questions. The patient consents to medication trial.              -- Metabolic profile and EKG monitoring obtained while on an atypical antipsychotic Olanzapine BMI: 28.02 kg/m TSH:  Pending Lipid Panel: Pending HbgA1c: Pending  QTc: 300/426 ms and 109 bpm on 10/07/2023             -- Encouraged patient to participate in unit milieu and in Scheduled group therapies: Did not attend per chart review.  Improvement in symptoms so as ready for discharge Other PRNS: Agitation protocol (Benadryl, Haldol, Ativan)    3. Medical Issues Being Addressed:   #Tobacco Use Disorder  Nicotine patch 21mg /24 hours ordered Smoking cessation encouraged  4. Discharge Planning:   -- Social work and case management to assist with discharge planning and identification of hospital follow-up needs prior to discharge  -- Estimated Discharge Date: 5-7 days  -- Discharge Concerns: Need to establish a safety plan; Medication compliance and effectiveness  -- Discharge Goals: Return home with outpatient referrals for mental health follow-up including medication management/psychotherapy   I certify that inpatient services furnished can reasonably be expected to improve the patient's condition.   This note was created using a voice recognition software as a result there may be grammatical errors inadvertently enclosed that do not reflect the nature of this encounter. Every attempt is made to correct such errors.    Dr. Liston Alba, MD PGY-2, Psychiatry Residency

## 2023-10-14 NOTE — Group Note (Signed)
Recreation Therapy Group Note   Group Topic:Relaxation  Group Date: 10/14/2023 Start Time: 1009 End Time: 1048 Facilitators: Rainn Zupko-McCall, LRT,CTRS Location: [address]   Group Topic: Relaxation  Goal Area(s) Addresses:  Patient will verbalize the benefits of music in relaxation. Patient will verbalize benefit of relaxation as a whole.  Intervention: Music App, Speaker  Activity:  LRT and patients discussed the importance of music and how it can be used for relaxation. Patients were then given to request the songs of their choosing. Patients would then explain the meaning of those songs to them.   Education: Relaxation, Discharge Planning.   Education Outcome: Acknowledges education/In group clarification offered/Needs additional education.    Affect/Mood: N/A   Participation Level: Did not attend    Clinical Observations/Individualized Feedback:    Plan: Continue to engage patient in RT group sessions 2-3x/week.   Fraidy Mccarrick-McCall, LRT,CTRS  10/14/2023 11:42 AM

## 2023-10-15 NOTE — [facility] Group Notes (Signed)
Adult Psychoeducational Group Note  Date:  10/15/2023 Time:  10:34 AM  Group Topic/Focus:  Goals Group:   The focus of this group is to help patients establish daily goals to achieve during treatment and discuss how the patient can incorporate goal setting into their daily lives to aide in recovery.  Participation Level:  Active  Participation Quality:  Appropriate  Affect:  Appropriate  Cognitive:  Appropriate  Insight: Appropriate  Engagement in Group:  Engaged  Modes of Intervention:  Discussion  Additional Comments:  The patient engaged in group.  Octavio Manns 10/15/2023, 10:34 AM

## 2023-10-15 NOTE — Plan of Care (Signed)
°  Problem: Education: °Goal: Emotional status will improve °Outcome: Progressing °Goal: Mental status will improve °Outcome: Progressing °  °Problem: Activity: °Goal: Interest or engagement in activities will improve °Outcome: Progressing °  °

## 2023-10-15 NOTE — Progress Notes (Signed)
   10/15/23 2100  Psych Admission Type (Psych Patients Only)  Admission Status Voluntary  Psychosocial Assessment  Patient Complaints Anxiety  Eye Contact Fair  Facial Expression Anxious  Affect Anxious  Speech Logical/coherent  Interaction Assertive  Motor Activity Other (Comment) (WNL)  Appearance/Hygiene Unremarkable  Behavior Characteristics Appropriate to situation  Mood Anxious;Depressed  Thought Process  Coherency WDL  Content WDL  Delusions None reported or observed  Perception WDL  Hallucination None reported or observed  Judgment WDL  Confusion None  Danger to Self  Current suicidal ideation?  (Denies)  Agreement Not to Harm Self Yes  Description of Agreement Verbal

## 2023-10-15 NOTE — [facility] Group Notes (Signed)

## 2023-10-15 NOTE — [facility] Group Notes (Signed)
Adult Psychoeducational Group Note  Date:  10/15/2023 Time:  9:03 PM  Group Topic/Focus:  Wrap-Up Group:   The focus of this group is to help patients review their daily goal of treatment and discuss progress on daily workbooks.  Participation Level:  Minimal  Participation Quality:  Resistant  Affect:  Flat  Cognitive:  Appropriate  Insight: Limited  Engagement in Group:  Poor  Modes of Intervention:  Discussion  Additional Comments:   Pt did not elaborated much when asked question about his day. Pt states he's had the same day he's always had and he didn't enjoy the visit he had today. Pt is set to d/c tomorrow and states he has no feeling about it. Pt denies everything  Vevelyn Pat 10/15/2023, 9:03 PM

## 2023-10-15 NOTE — Progress Notes (Signed)
Patient with increased anxiety and agitation. Anxiety 8/10. Benadryl 50 mg given per providers orders for agitation. Medication effective.

## 2023-10-15 NOTE — Plan of Care (Signed)

## 2023-10-15 NOTE — Progress Notes (Signed)
   10/15/23 0800  Psych Admission Type (Psych Patients Only)  Admission Status Voluntary  Psychosocial Assessment  Patient Complaints Anxiety  Eye Contact Fair  Facial Expression Flat  Affect Appropriate to circumstance  Speech Logical/coherent  Interaction Assertive  Motor Activity Slow  Appearance/Hygiene Unremarkable  Behavior Characteristics Appropriate to situation  Mood Anxious;Depressed  Thought Process  Coherency WDL  Content WDL  Delusions None reported or observed  Perception WDL  Hallucination None reported or observed  Judgment WDL  Confusion None  Danger to Self  Current suicidal ideation? Denies  Agreement Not to Harm Self Yes  Description of Agreement Verbal  Danger to Others  Danger to Others None reported or observed

## 2023-10-15 NOTE — Plan of Care (Signed)
  Problem: Activity: Goal: Interest or engagement in activities will improve Outcome: Progressing   Problem: Coping: Goal: Ability to verbalize frustrations and anger appropriately will improve Outcome: Progressing   Problem: Coping: Goal: Ability to identify and develop effective coping behavior will improve Outcome: Progressing

## 2023-10-15 NOTE — Group Note (Signed)
Recreation Therapy Group Note   Group Topic:Problem Solving  Group Date: 10/15/2023 Start Time: 1020 End Time: 1040 Facilitators: Augusten Lipkin-McCall, LRT,CTRS Location: [address] [employer]   Group Topic: Team Building, Problem Solving  Goal Area(s) Addresses:  Patient will effectively work with peer towards shared goal.  Patient will identify skills used to make activity successful.  Patient will identify how skills used during activity can be applied to reach post d/c goals.   Intervention: STEM Activity- Glass blower/designer  Group Description: Tallest Pharmacist, community. In teams of 5-6, patients were given 11 craft pipe cleaners. Using the materials provided, patients were instructed to compete again the opposing team(s) to build the tallest free-standing structure from floor level. The activity was timed; difficulty increased by Clinical research associate as Production designer, theatre/television/film continued.  Systematically resources were removed with additional directions for example, placing one arm behind their back, working in silence, and shape stipulations. LRT facilitated post-activity discussion reviewing team processes and necessary communication skills involved in completion. Patients were encouraged to reflect how the skills utilized, or not utilized, in this activity can be incorporated to positively impact support systems post discharge.  Education: Pharmacist, community, Scientist, physiological, Discharge Planning   Education Outcome: Acknowledges education/In group clarification offered/Needs additional education.    Affect/Mood: N/A   Participation Level: Did not attend    Clinical Observations/Individualized Feedback:     Plan: Continue to engage patient in RT group sessions 2-3x/week.   Jacky Dross-McCall, LRT,CTRS 10/15/2023 11:46 AM

## 2023-10-15 NOTE — Progress Notes (Signed)
   10/15/23 0000  Psych Admission Type (Psych Patients Only)  Admission Status Voluntary  Psychosocial Assessment  Patient Complaints Anxiety  Eye Contact Fair  Facial Expression Flat  Affect Appropriate to circumstance;Anxious  Speech Logical/coherent  Interaction Assertive  Motor Activity Slow  Appearance/Hygiene Unremarkable  Behavior Characteristics Appropriate to situation  Mood Anxious;Depressed  Thought Process  Coherency WDL  Content WDL  Delusions None reported or observed  Perception WDL  Hallucination None reported or observed  Judgment WDL  Confusion None  Danger to Self  Current suicidal ideation? Denies  Agreement Not to Harm Self Yes  Description of Agreement verbal  Danger to Others  Danger to Others None reported or observed

## 2023-10-15 NOTE — Progress Notes (Signed)
Patient ID: Devon Thompson, male   DOB: 13-Jan-1991, 32 y.o.   MRN: [id number] [facility] MD Progress Note  10/15/2023 7:28 AM JAYSTIN OVERGAARD  MRN:  [id number]  Principal Problem: Methamphetamine-induced psychotic disorder [facility]) Diagnosis: Principal Problem:   Methamphetamine-induced psychotic disorder [facility])   Reason for Admission:  Devon Thompson is a 32 y.o. male  with a past psychiatric history of polysubstance use (methamphetamines) with IV drug use, and prior drug rehabilitation hx . Patient initially arrived to [facility] on 10/06/2023 for psychosis in the setting of recent stimulant use, and admitted to [facility] voluntarily on 10/11/2023 for severe substance-induced psychosis and mood disturbances. Patient has a history of prior incarcerations and currently has a Engineer, drilling. UDS positive for cocaine and amphetamines on admission.  (admitted on 10/11/2023, total  LOS: 2 days )     Pertinent information discussed during bed progression: No acute events overnight    PRNs required overnight: None  Information Obtained Today During Patient Interview:  Patient evaluated at bedside, he refuses to get out of bed. Continues to present irritable, states " I'll get up when it's time to discharge me". He is ruminative about his sleep being disturbed overnight from activity on the unit.  He is reporting adequate appetite this morning. Denies any depression or anxiety.  Patient is reporting some ambiguous interest in following up with a rehabilitation program, reports he has spoken to his girlfriend about it. He agrees to have gf contacted today to work towards discharge. Per LCSW, who complete safety planning, patient would be going home with girlfriend.   On interview, suicidal ideations are not present . Homicidal ideations are not present.  There are no auditory hallucinations, visual hallucinations, paranoid ideations, or delusional thought processes.   Side effects to currently prescribed  medications are none. He is denying cravings for stimulants.  There are no somatic complaints. Reports regular bowel movements.   Collateral call to Jacquelyne Balint, patient's girlfriend, at [phone] on 10/15/2023 They have been together for the past 4 months. She reports the patient has been having escalating paranoia for the past 3 months, started acussing patient she was cheating on him. He has been making claims that people are trying to hurt him, that he is being followed. She is aware patient uses methamphetamines. She is aware he has been injecting drugs. She reports patient has been minimal with her. She is unsure if he is at baseline.   She reports she plans to visit the patient's 11/22 Friday evening, and reassess whether or not she feels comfortable with excepting patient back over the weekend.  She would like to speak with a provider on Saturday 11/23 and provide collateral numbness.   Past Psychiatric History: BPD at age 16 yo, but never treated. Substance Use disorder. Denies seeing any psychiatrist or therapist in the past. Per chart review, has been to ED frequently due to [facility], VH and paranoia. Denies previous suicide attempt but per chart review, he has history of suicide attempt in jail in 2022.  Family Medical History: Reports several family members on mother side have had MIs. Family Psychiatric History: Denied any family psych history.  Social History: Living Situation: Reports he lives in [place] with his girlfriend. 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  He denies any upcoming court dates.  Per chart review patient was released from jail earlier this year in  2024 Military: no    Access to firearms: Denies Past Medical History:  Past Medical History:  Diagnosis Date   Bipolar 1 disorder (HCC)    HTN (hypertension)    Schizophrenia (HCC)     Family History: History reviewed. No pertinent family history.  Current Medications: Current Facility-Administered Medications  Medication Dose Route Frequency Provider Last Rate Last Admin   acetaminophen (TYLENOL) tablet 650 mg  650 mg Oral Q6H PRN Charm Rings, NP       alum & mag hydroxide-simeth (MAALOX/MYLANTA) 200-200-20 MG/5ML suspension 30 mL  30 mL Oral Q4H PRN Charm Rings, NP       diphenhydrAMINE (BENADRYL) capsule 50 mg  50 mg Oral BID PRN Charm Rings, NP       Or   diphenhydrAMINE (BENADRYL) injection 50 mg  50 mg Intravenous BID PRN Charm Rings, NP       magnesium hydroxide (MILK OF MAGNESIA) suspension 30 mL  30 mL Oral Daily PRN Charm Rings, NP       nicotine (NICODERM CQ - dosed in mg/24 hours) patch 21 mg  21 mg Transdermal Daily Carrion-Carrero, Sukhmani Fetherolf, MD       OLANZapine (ZYPREXA) tablet 10 mg  10 mg Oral BID PRN Charm Rings, NP       Or   OLANZapine (ZYPREXA) injection 10 mg  10 mg Intramuscular BID PRN Charm Rings, NP       OLANZapine zydis (ZYPREXA) disintegrating tablet 5 mg  5 mg Oral QHS Carrion-Carrero, Delores Thelen, MD        Lab Results: No results found for this or any previous visit (from the past 48 hour(s)).  Blood Alcohol level:  Lab Results  Component Value Date   ETH <10 10/07/2023   ETH <10 11/16/2021    Metabolic Labs: No results found for: "HGBA1C", "MPG" No results found for: "PROLACTIN" No results found for: "CHOL", "TRIG", "HDL", "CHOLHDL", "VLDL", "LDLCALC"  Physical Findings: AIMS: No  CIWA:    COWS:     Psychiatric Specialty Exam:  Presentation  General Appearance: Appropriate for Environment; Casual; Fairly Groomed  Eye Contact:Minimal; went back to lay in bed mid interview.   Speech:Clear and Coherent; Normal Rate  Speech Volume:Normal    Mood and Affect  Mood: "Fine"  Affect: Incongruent; Irritable    Thought Process  Thought Processes:Coherent; Goal Directed; Linear  Descriptions  of Associations:Intact  Orientation:Grossly intact   Thought Content:Logical; WDL  History of Schizophrenia/Schizoaffective disorder:No  Duration of Psychotic Symptoms:N/A  Hallucinations:No data recorded  Ideas of Reference:Not apparent  Suicidal Thoughts: Denies  Homicidal Thoughts: Denies   Sensorium  Memory:Immediate Fair  Judgment:Fair  Insight: Fair   Art therapist  Concentration:Poor  Attention Span:Limited  Recall:Unable to assess due to limited participation.   Fund of Knowledge:Unable to assess.  Language:Good   Psychomotor Activity  Psychomotor Activity:Decreased, remains in bed   Assets  Assets:Communication Skills; Desire for Improvement; Resilience   Sleep  Sleep:Good    Physical Exam: Physical Exam Constitutional:      Appearance: Normal appearance.  Eyes:     Conjunctiva/sclera: Conjunctivae normal.     Pupils: Pupils are equal, round, and reactive to light.  Neurological:     Mental Status: He is oriented to person, place, and time.  Psychiatric:        Attention and Perception: Attention and perception normal.        Mood and Affect: Mood normal. Affect is flat.  Speech: Speech normal.        Behavior: Behavior is slowed and withdrawn. Behavior is cooperative.        Thought Content: Thought content normal.        Cognition and Memory: Cognition and memory normal.        Judgment: Judgment normal.     Comments: Slightly anxious    Review of Systems  All other systems reviewed and are negative.  Blood pressure 117/69, pulse 89, temperature 97.7 F (36.5 C), temperature source Oral, resp. rate 16, height 5\' 5"  (1.651 m), weight 76.4 kg, SpO2 98%. Body mass index is 28.02 kg/m.  Treatment Plan Summary: Daily contact with patient to assess and evaluate symptoms and progress in treatment   ASSESSMENT: Devon Thompson is a 32 y.o. male  with a past psychiatric history of polysubstance use (methamphetamines)  with IV drug use, and prior drug rehabilitation hx . Patient initially arrived to [facility] on 10/06/20[facility] for psychosis in the setting of recent stimulant use, and admitted to [facility] voluntarily on 10/11/2023 for severe substance-induced psychosis and mood disturbances.   Patient continues presenting as irritable, withdrawn to his room.  Will plan to contact patient's girlfriend tomorrow to reassess, patient could likely discharge by Saturday or Sunday.  We will continue encouraging patient to participate in unit milieu.   Diagnoses / Active Problems: Stimulant induced psychosis Rule out withdrawal induced mood disturbance Stimulant use disorder Tobacco use disorder  PLAN: Safety and Monitoring:  --  VOLUNTARY admission to inpatient psychiatric unit for safety, stabilization and treatment  -- Daily contact with patient to assess and evaluate symptoms and progress in treatment  -- Patient's case to be discussed in multi-disciplinary team meeting  -- Observation Level : q15 minute checks  -- Vital signs:  q12 hours  -- Precautions: suicide, elopement, and assault  2. Psychiatric Diagnoses and Treatment:  Continue Zyprexa Zydis to 5 mg once every evening  Continue Prozac 20 mg daily for depressive symptoms  -- The risks/benefits/side-effects/alternatives to this medication were discussed in detail with the patient and time was given for questions. The patient consents to medication trial.              -- Metabolic profile and EKG monitoring obtained while on an atypical antipsychotic Olanzapine BMI: 28.02 kg/m TSH:  Pending Lipid Panel: Pending HbgA1c: Pending  QTc: 300/426 ms and 109 bpm on 10/07/2023             -- Encouraged patient to participate in unit milieu and in Scheduled group therapies: Did not attend per chart review.  Improvement in symptoms so as ready for discharge Other PRNS: Agitation protocol (Benadryl, Haldol, Ativan)    3. Medical Issues Being Addressed:   #Tobacco Use  Disorder  Nicotine patch 21mg /24 hours ordered Smoking cessation encouraged  4. Discharge Planning:   -- Social work and case management to assist with discharge planning and identification of hospital follow-up needs prior to discharge  -- Estimated Discharge Date: Tentatively Saturday/Sunday, depending on collateral from girlfriend  -- Discharge Concerns: Need to establish a safety plan; Medication compliance and effectiveness  -- Discharge Goals: Return home with outpatient referrals for mental health follow-up including medication management/psychotherapy   I certify that inpatient services furnished can reasonably be expected to improve the patient's condition.   This note was created using a voice recognition software as a result there may be grammatical errors inadvertently enclosed that do not reflect the nature of this encounter. Every attempt  is made to correct such errors.    Dr. Liston Alba, MD PGY-2, Psychiatry Residency

## 2023-10-16 MED ORDER — NICOTINE 21 MG/24HR TD PT24: 21.0000 mg | MEDICATED_PATCH | Freq: Every day | TRANSDERMAL | 0 refills | Status: DC

## 2023-10-16 NOTE — Discharge Summary (Addendum)
Physician Discharge Summary Note Patient:  Devon Thompson is an 32 y.o., male MRN:  [id number] DOB:  July 26, 1991 Patient phone:  [phone] (home)  Patient address:   [address] [place] [address],  Total Time spent with patient: 30 minutes  Date of Admission:  10/11/2023 Date of Discharge: 10/16/2023  Reason for Admission:  severe substance-induced psychosis and mood disturbances   Devon Thompson is a 33 y.o. male  with a documented past psychiatric history of polysubstance use (methamphetamines, ), with IV drug use, and prior drug rehabilitation hx.  He has no formal prior psychiatric hospitalizations, but has had multiple ED visits for suicidal behaviors and a prior attempt via hanging when he was in prison. Patient initially arrived to [facility] on 10/06/2023 for psychosis in the setting of recent stimulant use, and admitted to [facility] voluntarily on 10/11/2023 for severe substance-induced psychosis and mood disturbances.  He reports no significant past medical history.  Patient has a history of prior incarcerations and currently has a Engineer, drilling.  UDS positive for cocaine and amphetamines on admission.   Principal Problem: Methamphetamine-induced psychotic disorder [facility]) Discharge Diagnoses: Principal Problem:   Methamphetamine-induced psychotic disorder [facility])   Past Psychiatric History: BPD at age 58 yo, but never treated. Substance Use disorder. Denies seeing any psychiatrist or therapist in the past. Per chart review, has been to ED frequently due to [facility], VH and paranoia. Denies previous suicide attempt but per chart review, he has history of suicide attempt in jail in 2022.  Family Medical History: Reports several family members on mother side have had MIs. Family Psychiatric History: Denied any family psych history.  Social History: Living Situation: Reports he lives in [place] with his girlfriend. 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  He denies any upcoming court dates.  Per chart review patient was released from jail earlier this year in 2024 Military: no   Hospital Course:    During the patient's hospitalization, patient had extensive initial psychiatric evaluation, and follow-up psychiatric evaluations every day.  Psychiatric diagnoses provided upon initial assessment: Principal Problem:   Methamphetamine-induced psychotic disorder (HCC)   The following medications were managed:  Upon admission, the following medications were changed / started / discontinued: Discontinue Risperdal Start Zyprexa Zydis 5 mg every 12 hours for psychosis Continue Prozac 20 mg daily for depressive symptoms  During the patient's stay, the following medications were changed / started / discontinued, with final adjustments by discharge: Continue Zyprexa Zydis to 5 mg once every evening  Continue Prozac 20 mg daily for depressive symptoms   During the hospitalization, patient had the following lab / imaging / testing abnormalities which require further evaluation / management / treatment: none  Patient's care was discussed during the interdisciplinary team meeting every day during the hospitalization.  The patient denies any side effects to prescribed psychiatric medication.  Gradually, patient started adjusting to milieu. The patient was evaluated each day by a clinical provider to ascertain response to treatment. Improvement was noted by the patient's report of decreasing symptoms, improved sleep and appetite, affect, medication tolerance, behavior, and participation in unit programming.  Patient was asked each day to complete a self inventory noting mood, mental status, pain, new symptoms, anxiety and concerns.    Symptoms were reported as significantly decreased or resolved completely  by discharge.  On day of discharge, the patient reports that their mood is stable. The patient denied having suicidal thoughts for more than 48 hours prior to discharge.  Patient denies having homicidal thoughts.  Patient denies having auditory hallucinations.  Patient denies any visual hallucinations or other symptoms of psychosis. The patient was motivated to continue taking medication with a goal of continued improvement in mental health.   The patient reports their target psychiatric symptoms of substance use withdrawal symptoms and anger responded well to the psychiatric medications, and the patient reports overall benefit other psychiatric hospitalization. Supportive psychotherapy was provided to the patient. The patient also participated in regular group therapy while hospitalized. Coping skills, problem solving as well as relaxation therapies were also part of the unit programming.  Labs were reviewed with the patient, and abnormal results were discussed with the patient.  The patient is able to verbalize their individual safety plan to this provider.  Behavioral Events: none seen on chart review  Restraints: none seen on chart review  # It is recommended to the patient to continue psychiatric medications as prescribed, after discharge from the hospital.    # It is recommended to the patient to follow up with your outpatient psychiatric provider and PCP.  # It was discussed with the patient, the impact of alcohol, drugs, tobacco have been there overall psychiatric and medical wellbeing, and total abstinence from substance use was recommended to the patient.  # Prescriptions provided or sent directly to preferred pharmacy at discharge. Patient agreeable to plan. Given opportunity to ask questions. Appears to feel comfortable with discharge.    # In the event of worsening symptoms, the patient is instructed to call the crisis hotline, 911 and or go to the nearest ED for appropriate  evaluation and treatment of symptoms. To follow-up with primary care provider for other medical issues, concerns and or health care needs  # Patient was discharged home with a plan to follow up as noted below.  Physical Findings: AIMS:  , ,  ,  ,    CIWA:    COWS:     Mental Status Exam: General Appearance: appears at stated age, casually dressed and groomed   Behavior: pleasant and cooperative   Psychomotor Activity: no psychomotor agitation or retardation noted   Eye Contact: fair  Speech: normal amount, tone, volume and fluency    Mood: euthymic  Affect: congruent, pleasant and interactive   Thought Process: linear, goal directed, no circumstantial or tangential thought process noted, no racing thoughts or flight of ideas  Descriptions of Associations: intact  Thought Content: no bizarre content, logical and future-oriented  Hallucinations: denies AH, VH , does not appear responding to stimuli  Delusions: no paranoia, delusions of control, grandeur, ideas of reference, thought broadcasting, and magical thinking  Suicidal Thoughts: denies SI, intention, plan  Homicidal Thoughts: denies HI, intention, plan   Alertness/Orientation: alert and fully oriented   Insight: fair, improved  Judgment: fair, improved   Memory: intact   Executive Functions  Concentration: intact  Attention Span: fair  Recall: intact  Fund of Knowledge: fair    Physical Exam  General: Pleasant, well-appearing. No acute distress. Pulmonary: Normal effort. No wheezing or rales. Skin: No obvious rash or lesions. Neuro: A&Ox3.No focal deficit.   Review of Systems  No reported symptoms  Blood pressure 117/69, pulse 89, temperature 97.7 F (36.5 C), temperature source Oral, resp. rate 16, height 5\' 5"  (1.651 m), weight 76.4 kg, SpO2 98%. Body mass index is  28.02 kg/m.  Assets  Assets:Communication Skills; Desire for Improvement; Resilience   Social History   Tobacco Use  Smoking Status  Every Day  Smokeless Tobacco Never   Tobacco Cessation:  Prescription not provided because: has been refusing medications  Blood Alcohol level:  Lab Results  Component Value Date   ETH <10 10/07/2023   ETH <10 11/16/2021    Metabolic Disorder Labs:  No results found for: "HGBA1C", "MPG" No results found for: "PROLACTIN" No results found for: "CHOL", "TRIG", "HDL", "CHOLHDL", "VLDL", "LDLCALC"   Is patient on multiple antipsychotic therapies at discharge:  No   Has Patient had three or more failed trials of antipsychotic monotherapy by history:  No  Recommended Plan for Multiple Antipsychotic Therapies: NA   Allergies as of 10/16/2023       Reactions   Penicillins Swelling, Other (See Comments)   Throat swells   Amoxicillin Swelling, Other (See Comments)   Tongue swells        Medication List    You have not been prescribed any medications.     Follow-up Information     [facility]. Go on 10/18/2023.   Why: You have a hospital follow up appointment on 10/18/23 at 10:00 am  .  The appointment will be held in person.  Following this appointment, you will be scheduled for a clinical assessment, to obtain necessary therapy and medication management services. Contact information: [address] [place] [address] [phone]                 Discharge recommendations:   Activity: as tolerated  Diet: heart healthy  # It is recommended to the patient to continue psychiatric medications as prescribed, after discharge from the hospital.     # It is recommended to the patient to follow up with your outpatient psychiatric provider -instructions on appointment date, time, and address (location) are provided to you in discharge paperwork  # Follow-up with outpatient primary care doctor and other specialists -for management of chronic medical disease, including: tobacco use  # Testing: Follow-up with outpatient provider for abnormal lab  results: none   # It was discussed with the patient, the impact of alcohol, drugs, tobacco have been there overall psychiatric and medical wellbeing, and total abstinence from substance use was recommended to the patient.   # Prescriptions provided or sent directly to preferred pharmacy at discharge. Patient agreeable to plan. Given opportunity to ask questions. Appears to feel comfortable with discharge.    # In the event of worsening symptoms, the patient is instructed to call the crisis hotline, 911, and or go to the nearest ED for appropriate evaluation and treatment of symptoms. To follow-up with primary care provider for other medical issues, concerns and or health care needs  Patient agrees with D/C instructions and plan.   I discussed my assessment, planned testing and intervention for the patient with Dr. Enedina Finner who agrees with my formulated course of action.   Signed: Lance Muss, MD, PGY-2 10/16/2023, 9:39 AM

## 2023-10-16 NOTE — [facility] Suicide Risk Assessment (Signed)
[facility] INPATIENT:  Family/Significant Other Suicide Prevention Education  Suicide Prevention Education:  Education Completed; Danny Lawless,  [phone]) has been identified by the patient as the family member/significant other with whom the patient will be residing, and identified as the person(s) who will aid the patient in the event of a mental health crisis (suicidal ideations/suicide attempt).  With written consent from the patient, the family member/significant other has been provided the following suicide prevention education, prior to the and/or following the discharge of the patient.  The suicide prevention education provided includes the following: Suicide risk factors Suicide prevention and interventions National Suicide Hotline telephone number [facility] assessment telephone number [facility] Emergency Assistance 911 [facility] and/or Residential Mobile Crisis Unit telephone number  Request made of family/significant other to: Remove weapons (e.g., guns, rifles, knives), all items previously/currently identified as safety concern.   Remove drugs/medications (over-the-counter, prescriptions, illicit drugs), all items previously/currently identified as a safety concern.  The family member/significant other verbalizes understanding of the suicide prevention education information provided.  The family member/significant other agrees to remove the items of safety concern listed above.  CSW spoke to [facility] 10/16/2023  Louise Rawson O Priest Lockridge 10/16/2023, 9:21 AM

## 2023-10-16 NOTE — [facility] Suicide Risk Assessment (Signed)
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 male  with a documented past psychiatric history of polysubstance use (methamphetamines, ), with IV drug use, and prior drug rehabilitation hx.  He has no formal prior psychiatric hospitalizations, but has had multiple ED visits for suicidal behaviors and a prior attempt via hanging when he was in prison. Patient initially arrived to [facility] on 10/06/2023 for psychosis in the setting of recent stimulant use, and admitted to [facility] voluntarily on 10/11/2023 for severe substance-induced psychosis and mood disturbances.  He reports no significant past medical history.  Patient has a history of prior incarcerations and currently has a Engineer, drilling.  UDS positive for cocaine and amphetamines on admission.   Hospital Course  During the patient's hospitalization, patient had extensive initial psychiatric evaluation, and follow-up psychiatric evaluations every day.   Psychiatric diagnoses provided upon initial assessment: Principal Problem:   Methamphetamine-induced psychotic disorder (HCC)    The following medications were managed:   Upon admission, the following medications were changed / started / discontinued: Discontinue Risperdal Start Zyprexa Zydis 5 mg every 12 hours for psychosis Continue Prozac 20 mg daily for depressive symptoms   During the patient's stay, the following medications were changed / started / discontinued, with final adjustments by discharge: Continue Zyprexa Zydis to 5 mg once every evening  Continue Prozac 20 mg daily for depressive symptoms    During the hospitalization, patient had the following lab / imaging / testing abnormalities which  require further evaluation / management / treatment: none   Patient's care was discussed during the interdisciplinary team meeting every day during the hospitalization.   The patient denies any side effects to prescribed psychiatric medication.   Gradually, patient started adjusting to milieu. The patient was evaluated each day by a clinical provider to ascertain response to treatment. Improvement was noted by the patient's report of decreasing symptoms, improved sleep and appetite, affect, medication tolerance, behavior, and participation in unit programming.  Patient was asked each day to complete a self inventory noting mood, mental status, pain, new symptoms, anxiety and concerns.     Symptoms were reported as significantly decreased or resolved completely by discharge.    On day of discharge, the patient reports that their mood is stable. The patient denied having suicidal thoughts for more than 48 hours prior to discharge.  Patient denies having homicidal thoughts.  Patient denies having auditory hallucinations.  Patient denies any visual hallucinations or other symptoms of psychosis. The patient was motivated to continue taking medication with a goal of continued improvement in mental health.    The patient reports their target psychiatric symptoms of substance use withdrawal symptoms and anger responded well to the psychiatric medications, and the patient reports overall benefit other psychiatric hospitalization. Supportive psychotherapy was provided to the patient. The patient also participated in regular group therapy while hospitalized. Coping skills, problem solving as well as relaxation therapies were also part of the unit programming.   Labs were reviewed with the patient, and abnormal results were discussed with the patient.   The patient is able to verbalize their individual safety plan to this provider.   Behavioral Events: none seen on chart review   Restraints: none seen on  chart review   # It  is recommended to the patient to continue psychiatric medications as prescribed, after discharge from the hospital.     # It is recommended to the patient to follow up with your outpatient psychiatric provider and PCP.   # It was discussed with the patient, the impact of alcohol, drugs, tobacco have been there overall psychiatric and medical wellbeing, and total abstinence from substance use was recommended to the patient.   # Prescriptions provided or sent directly to preferred pharmacy at discharge. Patient agreeable to plan. Given opportunity to ask questions. Appears to feel comfortable with discharge.    # In the event of worsening symptoms, the patient is instructed to call the crisis hotline, 911 and or go to the nearest ED for appropriate evaluation and treatment of symptoms. To follow-up with primary care provider for other medical issues, concerns and or health care needs   # Patient was discharged home with a plan to follow up as noted below.  Musculoskeletal: Strength & Muscle Tone: within normal limits Gait & Station: normal Patient leans: N/A  Psychiatric Specialty Exam  General Appearance: appears at stated age, casually dressed and groomed    Behavior: pleasant and cooperative    Psychomotor Activity: no psychomotor agitation or retardation noted    Eye Contact: fair  Speech: normal amount, tone, volume and fluency      Mood: euthymic  Affect: congruent, pleasant and interactive    Thought Process: linear, goal directed, no circumstantial or tangential thought process noted, no racing thoughts or flight of ideas  Descriptions of Associations: intact  Thought Content: no bizarre content, logical and future-oriented  Hallucinations: denies AH, VH , does not appear responding to stimuli  Delusions: no paranoia, delusions of control, grandeur, ideas of reference, thought broadcasting, and magical thinking  Suicidal Thoughts: denies SI, intention,  plan  Homicidal Thoughts: denies HI, intention, plan    Alertness/Orientation: alert and fully oriented    Insight: fair, improved  Judgment: fair, improved    Memory: intact    Executive Functions  Concentration: intact  Attention Span: fair  Recall: intact  Fund of Knowledge: fair      Physical Exam  General: Pleasant, well-appearing. No acute distress. Pulmonary: Normal effort. No wheezing or rales. Skin: No obvious rash or lesions. Neuro: A&Ox3.No focal deficit.     Review of Systems  No reported symptoms Blood pressure 117/69, pulse 89, temperature 97.7 F (36.5 C), temperature source Oral, resp. rate 16, height 5\' 5"  (1.651 m), weight 76.4 kg, SpO2 98%. Body mass index is 28.02 kg/m.  Mental Status Per Nursing Assessment::   On Admission:  NA  Demographic Factors:  Male and Caucasian Loss Factors: Legal issues Historical Factors: Impulsivity Risk Reduction Factors:   Positive social support   Continued Clinical Symptoms:  Alcohol/Substance Abuse/Dependencies  Cognitive Features That Contribute To Risk:  Closed-mindedness    Suicide Risk:  Mild: There are no identifiable suicide plans, no associated intent, mild dysphoria and related symptoms, good self-control (both objective and subjective assessment), few other risk factors, and identifiable protective factors, including available and accessible social support.    Follow-up Information     [facility], [facility]. Go on 10/18/2023.   Why: You have a hospital follow up appointment on 10/18/23 at 10:00 am  .  The appointment will be held in person.  Following this appointment, you will be scheduled for a clinical assessment, to obtain necessary therapy and medication management services. Contact information: [address] [place] [address]  [phone]                 Plan Of Care/Follow-up recommendations:  Activity: as tolerated   Diet: heart healthy   # It is  recommended to the patient to continue psychiatric medications as prescribed, after discharge from the hospital.     # It is recommended to the patient to follow up with your outpatient psychiatric provider -instructions on appointment date, time, and address (location) are provided to you in discharge paperwork   # Follow-up with outpatient primary care doctor and other specialists -for management of chronic medical disease, including: tobacco use   # Testing: Follow-up with outpatient provider for abnormal lab results: none   # It was discussed with the patient, the impact of alcohol, drugs, tobacco have been there overall psychiatric and medical wellbeing, and total abstinence from substance use was recommended to the patient.   # Prescriptions provided or sent directly to preferred pharmacy at discharge. Patient agreeable to plan. Given opportunity to ask questions. Appears to feel comfortable with discharge.    # In the event of worsening symptoms, the patient is instructed to call the crisis hotline, 911, and or go to the nearest ED for appropriate evaluation and treatment of symptoms. To follow-up with primary care provider for other medical issues, concerns and or health care needs   Lance Muss, MD 10/16/2023, 9:36 AM

## 2023-10-16 NOTE — Progress Notes (Signed)
   10/16/23 0900  Psych Admission Type (Psych Patients Only)  Admission Status Voluntary  Psychosocial Assessment  Patient Complaints Anxiety  Eye Contact Fair  Facial Expression Anxious  Affect Anxious  Speech Logical/coherent  Interaction Assertive  Motor Activity Other (Comment) (steady gait)  Behavior Characteristics Cooperative;Appropriate to situation  Mood Anxious;Depressed  Thought Process  Coherency WDL  Content WDL  Delusions None reported or observed  Perception WDL  Hallucination None reported or observed  Judgment WDL  Confusion None  Danger to Self  Current suicidal ideation? Denies  Agreement Not to Harm Self Yes  Description of Agreement agreed to contact staff before acting on harmful thoughts  Danger to Others  Danger to Others None reported or observed

## 2023-10-16 NOTE — Progress Notes (Signed)
  [facility] Adult Case Management Discharge Plan :  Will you be returning to the same living situation after discharge:  Yes,  patient will living with gf At discharge, do you have transportation home?: Yes,  Patient needs a taxi to go home  Do you have the ability to pay for your medications: Yes,  Patient does not have insurance  Release of information consent forms completed and in the chart;  Patient's signature needed at discharge.  Patient to Follow up at:  Follow-up Information     [facility], [facility]. Go on 10/18/2023.   Why: You have a hospital follow up appointment on 10/18/23 at 10:00 am  .  The appointment will be held in person.  Following this appointment, you will be scheduled for a clinical assessment, to obtain necessary therapy and medication management services. Contact information: [address] [place] [address] [phone]                 Next level of care provider has access to [facility] Link:no  Safety Planning and Suicide Prevention discussed: Yes,  CSW spoke to [facility] 10/16/2023     Has patient been referred to the Quitline?: Patient refused referral for treatment  Patient has been referred for addiction treatment: Patient refused referral for treatment.  Cleland Simkins O Seara Hinesley, LCSWA 10/16/2023, 9:25 AM

## 2023-10-16 NOTE — Progress Notes (Signed)
Pt discharged to lobby with taxi voucher. Pt was stable and appreciative at that time. All papers were given and valuables returned. Verbal understanding expressed. Denies SI/HI and A/VH. Pt given opportunity to express concerns and ask questions.

## 2024-01-31 ENCOUNTER — Other Ambulatory Visit: Payer: Self-pay

## 2024-01-31 ENCOUNTER — Emergency Department: Payer: Self-pay

## 2024-01-31 ENCOUNTER — Emergency Department
Admission: EM | Admit: 2024-01-31 | Discharge: 2024-01-31 | Discharge status: Against Medical Advice | Payer: Self-pay | Attending: Emergency Medicine | Admitting: Emergency Medicine

## 2024-01-31 DIAGNOSIS — Z5321 Procedure and treatment not carried out due to patient leaving prior to being seen by health care provider: Secondary | ICD-10-CM | POA: Insufficient documentation

## 2024-01-31 DIAGNOSIS — R109 Unspecified abdominal pain: Secondary | ICD-10-CM | POA: Insufficient documentation

## 2024-01-31 DIAGNOSIS — R39198 Other difficulties with micturition: Secondary | ICD-10-CM | POA: Insufficient documentation

## 2024-01-31 LAB — COMPREHENSIVE METABOLIC PANEL
ALT: 18 U/L (ref 0–44)
AST: 20 U/L (ref 15–41)
Albumin: 4.4 g/dL (ref 3.5–5.0)
Alkaline Phosphatase: 49 U/L (ref 38–126)
Anion gap: 13 (ref 5–15)
BUN: 11 mg/dL (ref 6–20)
CO2: 24 mmol/L (ref 22–32)
Calcium: 9.4 mg/dL (ref 8.9–10.3)
Chloride: 104 mmol/L (ref 98–111)
Creatinine, Ser: 1.05 mg/dL (ref 0.61–1.24)
GFR, Estimated: >60 mL/min (ref >60)
Glucose, Bld: 104 mg/dL — ABNORMAL HIGH (ref 70–99)
Potassium: 3.7 mmol/L (ref 3.5–5.1)
Sodium: 141 mmol/L (ref 135–145)
Total Bilirubin: 0.8 mg/dL (ref 0.0–1.2)
Total Protein: 7.9 g/dL (ref 6.5–8.1)

## 2024-01-31 LAB — CBC WITH DIFFERENTIAL/PLATELET
Abs Immature Granulocytes: 0.05 10*3/uL (ref 0.00–0.07)
Basophils Absolute: 0.1 10*3/uL (ref 0.0–0.1)
Basophils Relative: 1 %
Eosinophils Absolute: 0.2 10*3/uL (ref 0.0–0.5)
Eosinophils Relative: 1 %
HCT: 44.4 % (ref 39.0–52.0)
Hemoglobin: 15.6 g/dL (ref 13.0–17.0)
Immature Granulocytes: 0 %
Lymphocytes Relative: 21 %
Lymphs Abs: 2.6 10*3/uL (ref 0.7–4.0)
MCH: 31.6 pg (ref 26.0–34.0)
MCHC: 35.1 g/dL (ref 30.0–36.0)
MCV: 90.1 fL (ref 80.0–100.0)
Monocytes Absolute: 1.2 10*3/uL — ABNORMAL HIGH (ref 0.1–1.0)
Monocytes Relative: 10 %
Neutro Abs: 8.3 10*3/uL — ABNORMAL HIGH (ref 1.7–7.7)
Neutrophils Relative %: 67 %
Platelets: 360 10*3/uL (ref 150–400)
RBC: 4.93 MIL/uL (ref 4.22–5.81)
RDW: 12.3 % (ref 11.5–15.5)
WBC: 12.4 10*3/uL — ABNORMAL HIGH (ref 4.0–10.5)
nRBC: 0 % (ref 0.0–0.2)

## 2024-01-31 LAB — LIPASE, BLOOD: Lipase: 33 U/L (ref 11–51)

## 2024-01-31 LAB — CK: Total CK: 342 U/L (ref 49–397)

## 2024-01-31 NOTE — ED Triage Notes (Signed)
 Pt reports bilateral flank pain that began yesterday, pt denies hx kidney stones. States he has had difficulty initiating a stream of urine but does not have dysuria.

## 2024-01-31 NOTE — ED Provider Triage Note (Signed)
 Emergency Medicine Provider Triage Evaluation Note  HOGAN HOOBLER , a 33 y.o. male  was evaluated in triage.  Pt complains of bilateral flank pain.  Patient reports dark-colored urine but no hematuria.  He reports that he is having difficulty urinating but no penile pain or discharge.  No testicular pain.  No history of kidney stones or kidney problems.  No fevers or chills, nausea, vomiting, diarrhea.  Review of Systems  Positive: Bilateral flank pain, dark urine with decreased urination Negative: Nausea, vomiting, diarrhea, constipation  Physical Exam  Ht 5\' 8"  (1.727 m)   Wt 83.9 kg   BMI 28.13 kg/m  Gen:   Awake, no distress   Resp:  Normal effort  MSK:   Moves extremities without difficulty  Other:    Medical Decision Making  Medically screening exam initiated at 8:05 PM.  Appropriate orders placed.  JOHNTAE BROXTERMAN was informed that the remainder of the evaluation will be completed by another provider, this initial triage assessment does not replace that evaluation, and the importance of remaining in the ED until their evaluation is complete.  Patient will have labs, urinalysis, CT stone study   Racheal Patches, PA-C 01/31/24 2005

## 2024-02-01 ENCOUNTER — Emergency Department
Admission: EM | Admit: 2024-02-01 | Discharge: 2024-02-01 | Disposition: A | Discharge status: Home / Self Care | Attending: Emergency Medicine | Admitting: Emergency Medicine

## 2024-02-01 ENCOUNTER — Emergency Department: Payer: Self-pay

## 2024-02-01 DIAGNOSIS — I10 Essential (primary) hypertension: Secondary | ICD-10-CM | POA: Insufficient documentation

## 2024-02-01 DIAGNOSIS — F1990 Other psychoactive substance use, unspecified, uncomplicated: Secondary | ICD-10-CM

## 2024-02-01 DIAGNOSIS — R45851 Suicidal ideations: Secondary | ICD-10-CM

## 2024-02-01 DIAGNOSIS — F22 Delusional disorders: Secondary | ICD-10-CM

## 2024-02-01 DIAGNOSIS — F1910 Other psychoactive substance abuse, uncomplicated: Secondary | ICD-10-CM | POA: Insufficient documentation

## 2024-02-01 DIAGNOSIS — Z5900 Homelessness unspecified: Secondary | ICD-10-CM

## 2024-02-01 NOTE — ED Notes (Signed)
Patient given sandwich tray at this time.

## 2024-02-01 NOTE — ED Triage Notes (Signed)
 Pt reports he feels crazy right now and needs a mental health evaluation. Pt refusing to elaborate more. Pt denies SI/HI

## 2024-02-01 NOTE — ED Provider Notes (Signed)
 [facility] Provider Note    Event Date/Time   First MD Initiated Contact with Patient 02/01/24 [phone]     (approximate)   History   Psychiatric Evaluation   HPI  Devon Thompson is a 33 y.o. male with history of hypertension, schizophrenia, bipolar disorder, substance use disorder who presents to the emergency department stating that he feels like people are following him and he is suicidal.  Denies any plan.  No HI.  No hallucinations.  States he has not used cocaine or methamphetamine in several days.  He denies any drug or alcohol use today.  Reports he is now homeless.  Previously here in the ED complaining of flank pain.  Does not mention this when I am in the room.   History provided by patient.    Past Medical History:  Diagnosis Date   Bipolar 1 disorder (HCC)    HTN (hypertension)    Schizophrenia (HCC)     No past surgical history on file.  MEDICATIONS:  Prior to Admission medications   Not on File    Physical Exam   Triage Vital Signs: ED Triage Vitals [02/01/24 0033]  Encounter Vitals Group     BP (!) (P) 145/91     Systolic BP Percentile      Diastolic BP Percentile      Pulse Rate (P) 98     Resp (P) 18     Temp (P) 98.7 F (37.1 C)     Temp Source (P) Oral     SpO2 (P) 98 %     Weight      Height      Head Circumference      Peak Flow      Pain Score      Pain Loc      Pain Education      Exclude from Growth Chart     Most recent vital signs: Vitals:   02/01/24 0033  BP: (!) 145/91  Pulse: 98  Resp: 18  Temp: 98.7 F (37.1 C)  SpO2: 98%    CONSTITUTIONAL: Alert, responds appropriately to questions. Well-appearing; well-nourished, sleeping when I entered the room and had to be woken up HEAD: Normocephalic, atraumatic EYES: Conjunctivae clear, pupils appear equal, sclera nonicteric ENT: normal nose; moist mucous membranes NECK: Supple, normal ROM CARD: RRR; S1 and S2 appreciated RESP: Normal chest  excursion without splinting or tachypnea; breath sounds clear and equal bilaterally; no wheezes, no rhonchi, no rales, no hypoxia or respiratory distress, speaking full sentences ABD/GI: Non-distended; soft, non-tender, no rebound, no guarding, no peritoneal signs BACK: The back appears normal EXT: Normal ROM in all joints; no deformity noted, no edema SKIN: Normal color for age and race; warm; no rash on exposed skin NEURO: Moves all extremities equally, normal speech PSYCH: Reports paranoia thinking people are following him, states he is suicidal.   ED Results / Procedures / Treatments   LABS: (all labs ordered are listed, but only abnormal results are displayed) Labs Reviewed  URINE DRUG SCREEN, QUALITATIVE ([facility] ONLY)  URINALYSIS, ROUTINE W REFLEX MICROSCOPIC     EKG:  EKG Interpretation Date/Time:    Ventricular Rate:    PR Interval:    QRS Duration:    QT Interval:    QTC Calculation:   R Axis:      Text Interpretation:           RADIOLOGY: My personal review and interpretation of imaging:    I have  personally reviewed all radiology reports.   No results found.   PROCEDURES:     Procedures    IMPRESSION / MDM / ASSESSMENT AND PLAN / ED COURSE  I reviewed the triage vital signs and the nursing notes.    Patient here with paranoia, SI with history of homelessness, substance abuse.  The patient is on the cardiac monitor to evaluate for evidence of arrhythmia and/or significant heart rate changes.   DIFFERENTIAL DIAGNOSIS (includes but not limited to):   Substance use disorder, homelessness, malingering, depression, anxiety, psychosis   Patient's presentation is most consistent with acute presentation with potential threat to life or bodily function.   PLAN: Labs were obtained earlier today and showed normal hemoglobin, electrolytes, CK, renal function.  He is declining repeat labs and I think this is reasonable.  He was complaining of flank pain  however is refusing CT of the abdomen pelvis.  Urinalysis, urine drug screen pending.  Will consult psychiatry and TTS.  He is here voluntarily.   MEDICATIONS GIVEN IN ED: Medications - No data to display   ED COURSE: Patient repeatedly asked for a urine sample which he has refused to give.  He complained to nursing staff initially of 8/10 back pain but refused CT imaging and has been sleeping and in no distress.  At this time he is medically cleared for psychiatric evaluation and disposition.   CONSULTS: TTS and psychiatry consulted.   OUTSIDE RECORDS REVIEWED: Reviewed previous psychiatric admission in November 2024.       FINAL CLINICAL IMPRESSION(S) / ED DIAGNOSES   Final diagnoses:  Suicidal ideation  Substance use disorder  Paranoia (HCC)  Homelessness     Rx / DC Orders   ED Discharge Orders     None        Note:  This document was prepared using Dragon voice recognition software and may include unintentional dictation errors.   Aprel Egelhoff, Layla Maw, DO 02/01/24 [phone]

## 2024-02-01 NOTE — Consult Note (Signed)
 [facility] Psychiatric Consult Initial  Patient Name: .Devon Thompson  MRN: [id number]  DOB: 1991-08-31  Consult Order details:  Orders (From admission, onward)     Start     Ordered   02/01/24 0037  CONSULT TO CALL ACT TEAM       Ordering Provider: Raelyn Number, DO  Provider:  (Not yet assigned)  Question:  Reason for Consult?  Answer:  Psych consult   02/01/24 0036   02/01/24 0037  IP CONSULT TO PSYCHIATRY       Ordering Provider: Raelyn Number, DO  Provider:  (Not yet assigned)  Question Answer Comment  Consult Timeframe STAT - requires a response within one hour   STAT timeframe requires provider to provider communication, has the provider to provider communication been completed Yes   Reason for Consult? paranoia, drug abuse, SI, homelessness   Contact phone number where the requesting provider can be reached [phone]      02/01/24 0036            Mode of Visit: In person   Psychiatry Consult Evaluation  Service Date: February 01, 2024 LOS:  LOS: 0 days  Chief Complaint "I wanted to hurt myself but not anymore"  Primary Psychiatric Diagnoses  Psychoactive substance abuse   Assessment  Devon Thompson is a 33 y.o. male admitted to [facility] on 02/01/24 at 12:53AM. Per initial triage note:  Pt reports he feels crazy right now and needs a mental health evaluation. Pt refusing to elaborate more. Pt denies SI/HI      Per chart review, appears pt has actually presented to [facility] on 01/31/24 for bilateral flank pain but left without being seen before presenting back again this morning. He was also seen at [facility] on 01/30/24, presenting with paranoia and "just feeling like I am going to die" in the context of recent stimulant use (crack cocaine and meth, multiple time sin the past 48 hours). He was psychiatrically cleared at the time and discharged.   On assessment today, patient is alert and oriented x4, irritable. Reports presenting to the emergency department because "I  wanted to hurt myself but not anymore". When asked what changed for him, states "I'm not high anymore". He denies suicidal, homicidal ideations. He denies auditory visual hallucinations or paranoia. He became frustrated with assessment questions, stating he was asked the same questions "5 times" by other staff. Verbalizes readiness to discharge.   Patient now denying suicidal ideations after metabolizing substances. There is no evidence of responding to internal stimuli. No paranoia or delusions elicited. Will psychiatrically clear patient for discharge at this time.  Diagnoses:  Active Hospital problems: Principal Problem:   Psychoactive substance abuse (HCC)   Plan   ## Psychiatric Medication Recommendations:  Defer to outpatient psychiatry  ## Medical Decision Making Capacity: Not specifically addressed in this encounter  ## Further Work-up:  -- Pt declining UDS  -- Defer to EDP  ## Disposition:-- There are no psychiatric contraindications to discharge at this time  ## Behavioral / Environmental: -Utilize compassion and acknowledge the patient's experiences while setting clear and realistic expectations for care.   ## Safety and Observation Level:  - Based on my clinical evaluation, I estimate the patient to be at LOW risk of self harm in the current setting. - At this time, we recommend  routine safety precautions. This decision is based on my review of the chart including patient's history and current presentation, interview of the patient, mental status examination,  and consideration of suicide risk including evaluating suicidal ideation, plan, intent, suicidal or self-harm behaviors, risk factors, and protective factors. This judgment is based on our ability to directly address suicide risk, implement suicide prevention strategies, and develop a safety plan while the patient is in the clinical setting. Please contact our team if there is a concern that risk level has changed.  CSSR  Risk Category:C-SSRS RISK CATEGORY: No Risk  Suicide Risk Assessment: Patient has following modifiable risk factors for suicide: medication noncompliance, substance abuse. Patient has following non-modifiable or demographic risk factors for suicide: male gender, history of suicide attempt, and psychiatric hospitalization Patient has the following protective factors against suicide: lack of current suicidality  Thank you for this consult request. Recommendations have been communicated to the primary team.  We will psychiatrically clear at this time.   Lauree Chandler, NP      History of Present Illness   Patient Report:  Patient reports he presented to the emergency department because "I wanted to hurt myself but not anymore". When asked what changed for him, states "I'm not high anymore". He reports daily use of "methamphetamines". States last use was prior to presenting to the emergency department. When asked about volume of use, states he does not know. Denies use of alcohol, nicotine, marijuana, crack/cocaine, other substances. Denies current suicidal ideations. Denies homicidal ideations. Denies auditory visual hallucinations or paranoia. Declined to answer about family psychiatric history or family history of suicide. Became frustrated with assessment questions, stating he was asked the same questions "5 times" by other staff. Verbalizes readiness to discharge.  Psych ROS:  Denies suicidal ideations Denies homicidal ideations Denies auditory visual hallucinations or paranoia  Collateral information:  Pt declined for collateral to be called.  Review of Systems  Respiratory:  Negative for shortness of breath.   Cardiovascular:  Negative for chest pain.  Psychiatric/Behavioral:  Positive for substance abuse.    Psychiatric and Social History  Psychiatric History:   Prev Dx/Sx: methamphetamine induced psychotic disorder, paranoia, hallucinations Home Meds (current): prozac,  hydroxyzine, zyprexa  Prior Psych Hospitalization: Yes, multiple  Prior Self Harm: suicide attempt at age 35 via cutting wrists, another attempt while in jail in 2022  Family Psych History: declined to share Family Hx suicide: declined to share  Social History:  Legal Hx: recent discharge from jail Living Situation: reports living alone Access to weapons/lethal means: Denies access to firearms or other weapons   Substance History He reports daily use of "methamphetamines". States last use was prior to presenting to the emergency department. When asked about volume of use, states he does not know. Denies use of alcohol, nicotine, marijuana, crack/cocaine, other substances.  Exam Findings   Vital Signs:  Temp:  [98.1 F (36.7 C)-98.7 F (37.1 C)] 98.2 F (36.8 C) (03/11 0841) Pulse Rate:  [81-108] 81 (03/11 0841) Resp:  [18-20] 20 (03/11 0841) BP: (120-162)/(88-107) 120/88 (03/11 0841) SpO2:  [98 %-100 %] 100 % (03/11 0841) Weight:  [83.9 kg] 83.9 kg (03/11 0036) Blood pressure 120/88, pulse 81, temperature 98.2 F (36.8 C), temperature source Oral, resp. rate 20, height 5\' 8"  (1.727 m), weight 83.9 kg, SpO2 100%. Body mass index is 28.13 kg/m.  Physical Exam Pulmonary:     Effort: Pulmonary effort is normal. No respiratory distress.  Neurological:     Mental Status: He is oriented to person, place, and time.  Psychiatric:        Cognition and Memory: Cognition normal.   Mental Status Exam:  General Appearance: Disheveled  Orientation:  Full (Time, Place, and Person)  Memory:  Immediate;   Fair  Concentration:  Concentration: Fair  Recall:  Fair  Attention  Fair  Eye Contact:  Fair  Speech:  Clear and Coherent and Normal Rate  Language:  Fair  Volume:  Increased  Mood: frustrated  Affect:  irritable  Thought Process:  Coherent, Goal Directed, and Linear  Thought Content:  Logical  Suicidal Thoughts:  No  Homicidal Thoughts:  No  Judgement: Chronically poor   Insight:   has some insight into presentation being related to substance use  Psychomotor Activity:  Normal  Akathisia:  No  Fund of Knowledge:  Fair      Assets:  Manufacturing systems engineer Resilience  Cognition:  WNL  ADL's:  Intact  AIMS (if indicated):        Other History   These have been pulled in through the EMR, reviewed, and updated if appropriate.  Family History:  The patient's family history is not on file.  Medical History: Past Medical History:  Diagnosis Date   Bipolar 1 disorder (HCC)    HTN (hypertension)    Schizophrenia (HCC)    Surgical History: No past surgical history on file.  Medications:  No current facility-administered medications for this encounter.  Current Outpatient Medications:    FLUoxetine (PROZAC) 20 MG capsule, Take 20 mg by mouth daily. Take 1 capsule (20 mg total) by mouth once daily for 7 days, Disp: , Rfl:    hydrOXYzine (ATARAX) 25 MG tablet, Take 25 mg by mouth 2 (two) times daily as needed.  Take 1 tablet (25 mg total) by mouth 2 (two) times daily as needed for Anxiety for up to 7 days, Disp: , Rfl:    OLANZapine (ZYPREXA) 5 MG tablet, Take 5 mg by mouth at bedtime.  Take 1 tablet (5 mg total) by mouth at bedtime for 7 days, Disp: , Rfl:   Allergies: Allergies  Allergen Reactions   Penicillins Swelling and Other (See Comments)    Throat swells   Amoxicillin Swelling and Other (See Comments)    Tongue swells   Lauree Chandler, NP

## 2024-02-01 NOTE — ED Notes (Signed)
 Patient had no complaints of pain when assessing upon arrival. CT came to do a scan on patient to see if he had any kidney stones from earlier before eloping. MD stated he had no complaints when she asked and neither did this RN get that information from the patient. When asking patient if his back was still bothering him he stated yes on a scale of 1-10 an 8. MD stated he could have scan if it was bothering him. CT went into room to get patient and patient refused scan.

## 2024-02-01 NOTE — ED Notes (Signed)
 Previous triage vital signs obtained on arrival by [facility]. W, ED RN. Registration due to merge charting due to initial registration issue.

## 2024-02-01 NOTE — ED Notes (Addendum)
 Patient here for mental health evaluation states he is feeling crazy right now but denies HI/SI. Patient refused blood work in triage. When asking patient if he would be willing to give blood now stated he really did not want to since he was here earlier and gave blood. Informed MD.

## 2024-02-01 NOTE — ED Notes (Signed)
vol/psych consult ordered/pending.. 

## 2024-02-01 NOTE — BH Assessment (Signed)
 Comprehensive Clinical Assessment (CCA) Screening, Triage and Referral Note  02/01/2024 Devon Thompson [id number] Recommendations for Services/Supports/Treatments: Disposition pending Devon Thompson. [name] is a 33 y.o., Caucasian, Not Hispanic or Latino ethnicity, ENGLISH speaking male who presented ED voluntarily for an evaluation. Per triage note: Pt reports he feels crazy right now and needs a mental health evaluation. Pt refusing to elaborate more. Pt denies SI/HI  Pt was resting upon this writer's arrival. When asked what brought him to the hospital the pt. stated, "People following me for days now." Pt reported that he used meth within the last 24 hours. Reports that the he uses meth on occasion; denies daily use. Pt reported that he lives with his girlfriend and things have been strained. Pt denied having court/legal issues. The pt. denied current SI; however, he admitted to experiencing SI yesterday after ingesting meth. The pt reported feeling scared/paranoid. The pt. had fair insight and impaired judgement. Pt reported that he has been off of his psych medications. Pt reported that he feels generalized pain all over his body. The pt. is not connected to psychiatry services "at [facility]" where he was prescribed medication. Pt reported having sleep/appetite disturbance Pt presented with an anxious mood; affect was congruent. Pt's BAL is unremarkable; UDS pending. Pt denied current SI/AH/HI. Pt reported that he has visual hallucinations of people following him.  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 Self  What Is the Reason for Your Visit/Call Today? Pt presents with auditory hallucinations with commands to hurt himself. Pt very paranoid during triage and stating "I don't want to die. I don't want you to kill me." Pt states he has cut himself in the past month with intent of suicide. Pt will not allow this RN to obtain V/S and is asking for a different RN. Pt denies being on any medication.  How Long Has This Been Causing You Problems? 1-6 months  What Do You Feel Would Help You the Most Today? Treatment for Depression or other mood problem   Have You Recently Had Any Thoughts About Hurting Yourself? Yes  Are You Planning to Commit Suicide/Harm Yourself At This time? No   Have you Recently Had Thoughts About Hurting Someone Devon Thompson? Yes  Are You Planning to Harm Someone at This Time? No  Explanation: n/a   Have You Used Any Alcohol or Drugs in the Past 24 Hours? No  How Long Ago Did You Use Drugs or Alcohol? No data recorded What Did You Use and How Much? Pt denied   Do You Currently Have a Therapist/Psychiatrist? No  Name of Therapist/Psychiatrist: n/a   Have You Been Recently Discharged From Any Office Practice or Programs? Yes  Explanation of Discharge From Practice/Program: Pt was discharged from [facility] ED 10/07/23    CCA Screening Triage Referral Assessment Type of Contact: Face-to-Face  Telemedicine Service Delivery:   Is this Initial or Reassessment?   Date Telepsych consult ordered in [facility]:    Time Telepsych consult ordered in [facility]:    Location of Assessment: [facility] ED  Provider Location: [facility] ED    Collateral Involvement: None provided   Does Patient Have a Court Appointed Legal Guardian? No data recorded Name and Contact of Legal Guardian: No data recorded If Minor and Not Living with Parent(s), Who has Custody? n/a  Is CPS involved or ever been  involved? Never  Is APS involved or ever been involved? Never   Patient Determined To Be At Risk for Harm To Self or Others Based on Review of Patient Reported Information or Presenting Complaint? Yes, for Self-Harm  Method: No Plan  Availability of Means: No access or NA  Intent: Vague intent or NA  Notification Required: No need or identified person  Additional Information for Danger to Others Potential: Active psychosis  Additional Comments for Danger to Others Potential: n/a  Are There Guns or Other Weapons in Your Home? No  Types of Guns/Weapons: n/a  Are These Weapons Safely Secured?                            No  Who Could Verify You Are Able To Have These Secured: n/a  Do You Have any Outstanding Charges, Pending Court Dates, Parole/Probation? Pt reported that he is on probation  Contacted To Inform of Risk of Harm To Self or Others: -- (n/a)   Does Patient Present under Involuntary Commitment? No    County of Residence: [place]   Patient Currently Receiving the Following Services: Not Receiving Services   Determination of Need: Emergent (2 hours)   Options For Referral: Inpatient Hospitalization   Disposition Recommendation per psychiatric provider: Pending  Kitai Purdom R Griselle Rufer, LCAS

## 2024-02-01 NOTE — ED Provider Notes (Signed)
 Patient cleared by psychiatric team for discharge   Jene Every, MD 02/01/24 1042

## 2024-02-01 NOTE — ED Notes (Addendum)
 Pt became agitated with psych provider, requesting to leave. EDP R. Cyril Loosen and Brita Romp psych NP aware of pt requesting to leave. Pt threatening security and [employer] staff. Offered pt DC papers before leaving, refused. Repeat vitals refused. Belongings returned.
